# Patient Record
Sex: Female | Born: 1960 | Hispanic: Yes | Marital: Married | Smoking: Never smoker
Health system: Southern US, Community
[De-identification: ages and names within clinical notes are randomized; demographics above are authoritative.]

## PROBLEM LIST (undated history)

## (undated) DIAGNOSIS — E079 Disorder of thyroid, unspecified: Secondary | ICD-10-CM

## (undated) DIAGNOSIS — K219 Gastro-esophageal reflux disease without esophagitis: Secondary | ICD-10-CM

## (undated) DIAGNOSIS — D75839 Thrombocytosis, unspecified: Secondary | ICD-10-CM

## (undated) HISTORY — DX: Disorder of thyroid, unspecified: E07.9

## (undated) HISTORY — DX: Gastro-esophageal reflux disease without esophagitis: K21.9

## (undated) HISTORY — DX: Thrombocytosis, unspecified: D75.839

## (undated) HISTORY — PX: BACK SURGERY: SHX140

---

## 2017-09-04 DIAGNOSIS — E039 Hypothyroidism, unspecified: Secondary | ICD-10-CM | POA: Insufficient documentation

## 2018-11-05 ENCOUNTER — Ambulatory Visit: Payer: Self-pay | Admitting: Family Medicine

## 2018-11-11 ENCOUNTER — Encounter: Payer: Self-pay | Admitting: Family Medicine

## 2018-11-11 ENCOUNTER — Ambulatory Visit (INDEPENDENT_AMBULATORY_CARE_PROVIDER_SITE_OTHER): Payer: Medicare Other | Admitting: Family Medicine

## 2018-11-11 ENCOUNTER — Other Ambulatory Visit: Payer: Self-pay

## 2018-11-11 ENCOUNTER — Telehealth: Payer: Self-pay | Admitting: Family Medicine

## 2018-11-11 VITALS — BP 102/64 | HR 99 | Temp 98.0°F | Ht 59.5 in | Wt 123.8 lb

## 2018-11-11 DIAGNOSIS — M5416 Radiculopathy, lumbar region: Secondary | ICD-10-CM

## 2018-11-11 DIAGNOSIS — E039 Hypothyroidism, unspecified: Secondary | ICD-10-CM | POA: Diagnosis not present

## 2018-11-11 LAB — CBC WITH DIFFERENTIAL/PLATELET
Basophils Absolute: 0 10*3/uL (ref 0.0–0.1)
Basophils Relative: 0.4 % (ref 0.0–3.0)
Eosinophils Absolute: 0.1 10*3/uL (ref 0.0–0.7)
Eosinophils Relative: 1 % (ref 0.0–5.0)
HCT: 42 % (ref 36.0–46.0)
Hemoglobin: 13.9 g/dL (ref 12.0–15.0)
Lymphocytes Relative: 29 % (ref 12.0–46.0)
Lymphs Abs: 1.8 10*3/uL (ref 0.7–4.0)
MCHC: 33 g/dL (ref 30.0–36.0)
MCV: 84.4 fl (ref 78.0–100.0)
Monocytes Absolute: 0.3 10*3/uL (ref 0.1–1.0)
Monocytes Relative: 4.2 % (ref 3.0–12.0)
Neutro Abs: 4 10*3/uL (ref 1.4–7.7)
Neutrophils Relative %: 65.4 % (ref 43.0–77.0)
Platelets: 474 10*3/uL — ABNORMAL HIGH (ref 150.0–400.0)
RBC: 4.98 Mil/uL (ref 3.87–5.11)
RDW: 14.1 % (ref 11.5–15.5)
WBC: 6.1 10*3/uL (ref 4.0–10.5)

## 2018-11-11 LAB — COMPREHENSIVE METABOLIC PANEL
ALT: 27 U/L (ref 0–35)
AST: 22 U/L (ref 0–37)
Albumin: 4.6 g/dL (ref 3.5–5.2)
Alkaline Phosphatase: 81 U/L (ref 39–117)
BUN: 14 mg/dL (ref 6–23)
CO2: 32 mEq/L (ref 19–32)
Calcium: 10.6 mg/dL — ABNORMAL HIGH (ref 8.4–10.5)
Chloride: 101 mEq/L (ref 96–112)
Creatinine, Ser: 0.63 mg/dL (ref 0.40–1.20)
GFR: 96.49 mL/min (ref >60.00)
Glucose, Bld: 107 mg/dL — ABNORMAL HIGH (ref 70–99)
Potassium: 3.8 mEq/L (ref 3.5–5.1)
Sodium: 140 mEq/L (ref 135–145)
Total Bilirubin: 0.3 mg/dL (ref 0.2–1.2)
Total Protein: 7.3 g/dL (ref 6.0–8.3)

## 2018-11-11 LAB — LIPID PANEL
Cholesterol: 255 mg/dL — ABNORMAL HIGH (ref 0–200)
HDL: 43.6 mg/dL (ref >39.00)
NonHDL: 211.24
Total CHOL/HDL Ratio: 6
Triglycerides: 273 mg/dL — ABNORMAL HIGH (ref 0.0–149.0)
VLDL: 54.6 mg/dL — ABNORMAL HIGH (ref 0.0–40.0)

## 2018-11-11 LAB — LDL CHOLESTEROL, DIRECT: Direct LDL: 165 mg/dL

## 2018-11-11 NOTE — Progress Notes (Signed)
esta

## 2018-11-11 NOTE — Telephone Encounter (Signed)
I have updated the patient chart to indicate that she will need a spanish interpreter going forward.

## 2018-11-11 NOTE — Progress Notes (Signed)
Subjective:    Patient ID: Cynthia Mcpherson, female    DOB: 1961-01-23, 58 y.o.   MRN: [id number]  HPI  Patient presents to establish with PCP.  She moved to the area from [facility]  Past medical history includes lumbar radiculopathy and hypothyroidism.  Currently uses Flexeril as needed for muscle spasms; will make a referral to neurosurgeon in the area for follow-up, had back surgery a couple of years ago and was told she would need neurosurgery follow-up.  Patient currently on levothyroxine 100 mcg/day.  Has not had blood work checked in the past 6 to 8 months.   Past medical, surgical, social, family history reviewed and updated accordingly in chart  Patient Active Problem List   Diagnosis Date Noted  . Acquired hypothyroidism 11/12/2018  . Lumbar radiculopathy 11/12/2018   Social History   Tobacco Use  . Smoking status: Never Smoker  . Smokeless tobacco: Never Used  Substance Use Topics  . Alcohol use: Not Currently   History reviewed. No pertinent family history.  Past Surgical History:  Procedure Laterality Date  . BACK SURGERY       Review of Systems  Constitutional: Negative for chills, fatigue and fever.  HENT: Negative for congestion, ear pain, sinus pain and sore throat.   Eyes: Negative.   Respiratory: Negative for cough, shortness of breath and wheezing.   Cardiovascular: Negative for chest pain, palpitations and leg swelling.  Gastrointestinal: Negative for abdominal pain, diarrhea, nausea and vomiting.  Genitourinary: Negative for dysuria, frequency and urgency.  Musculoskeletal: Negative for arthralgias and myalgias.  Skin: Negative for color change, pallor and rash.  Neurological: Negative for syncope, light-headedness and headaches.  Psychiatric/Behavioral: The patient is not nervous/anxious.       Objective:   Physical Exam Vitals signs and nursing note reviewed.  Constitutional:      General: She is not in acute distress.     Appearance: She is not ill-appearing, toxic-appearing or diaphoretic.  HENT:     Head: Normocephalic.  Eyes:     Extraocular Movements: Extraocular movements intact.     Conjunctiva/sclera: Conjunctivae normal.     Pupils: Pupils are equal, round, and reactive to light.  Neck:     Musculoskeletal: Normal range of motion and neck supple. No neck rigidity.     Thyroid: No thyromegaly or thyroid tenderness.     Vascular: No carotid bruit.  Cardiovascular:     Rate and Rhythm: Normal rate and regular rhythm.     Heart sounds: Normal heart sounds.  Pulmonary:     Effort: Pulmonary effort is normal. No respiratory distress.     Breath sounds: Normal breath sounds.  Musculoskeletal: Normal range of motion.     Right lower leg: No edema.     Left lower leg: No edema.     Comments: Able to bend forward, back, side to side with no issues.   Lymphadenopathy:     Cervical: No cervical adenopathy.  Skin:    General: Skin is warm and dry.     Coloration: Skin is not jaundiced or pale.  Neurological:     General: No focal deficit present.     Mental Status: She is alert and oriented to person, place, and time.  Psychiatric:        Mood and Affect: Mood normal.        Behavior: Behavior normal.       Today's Vitals   11/11/18 1008  BP: 102/64  Pulse: 99  Temp: 98 F (36.7 C)  SpO2: 100%  Weight: 123 lb 12.8 oz (56.2 kg)  Height: 4' 11.5" (1.511 m)   Body mass index is 24.59 kg/m.     Assessment & Plan:   Hypothyroidism - she will continue levothyroxine at 100 mcg.  We will get new lab work to follow-up on levels.  Patient request endocrinology for evaluation of her hypothyroidism.  Lumbar radiculopathy- patient will continue Flexeril as needed and do daily stretching exercises.  She requests referral to neurosurgery due to history of back surgery.  Referral placed.  We will plan her to follow-up in 6 months for recheck of chronic medical conditions.  She is allergic to  clinic sooner if any issues arise.

## 2018-11-11 NOTE — Telephone Encounter (Signed)
Thank you :)

## 2018-11-12 ENCOUNTER — Encounter: Payer: Self-pay | Admitting: Family Medicine

## 2018-11-12 DIAGNOSIS — E039 Hypothyroidism, unspecified: Secondary | ICD-10-CM | POA: Insufficient documentation

## 2018-11-12 DIAGNOSIS — M5416 Radiculopathy, lumbar region: Secondary | ICD-10-CM | POA: Insufficient documentation

## 2018-11-12 LAB — THYROID PANEL WITH TSH
Free Thyroxine Index: 3.6 (ref 1.4–3.8)
T3 Uptake: 31 % (ref 22–35)
T4, Total: 11.5 ug/dL (ref 5.1–11.9)
TSH: 0.17 mIU/L — ABNORMAL LOW (ref 0.40–4.50)

## 2018-11-12 MED ORDER — TIROSINT 75 MCG PO CAPS: 75.0000 ug | ORAL_CAPSULE | Freq: Every day | ORAL | 1 refills | Status: DC

## 2018-11-12 NOTE — Addendum Note (Signed)
Addended by: Philis Nettle on: 11/12/2018 12:41 PM   Modules accepted: Orders

## 2018-11-25 ENCOUNTER — Other Ambulatory Visit: Payer: Self-pay

## 2018-11-25 ENCOUNTER — Telehealth: Payer: Self-pay | Admitting: Family Medicine

## 2018-11-25 DIAGNOSIS — E039 Hypothyroidism, unspecified: Secondary | ICD-10-CM

## 2018-11-25 MED ORDER — TIROSINT 75 MCG PO CAPS: 75.0000 ug | ORAL_CAPSULE | Freq: Every day | ORAL | 1 refills | Status: DC

## 2018-11-25 NOTE — Telephone Encounter (Signed)
rx refill Levothyroxine Sodium (TIROSINT) 75 MCG CAPS Medication cyclobenzaprine (FLEXERIL) 10 MG tablet [  PHARMACY [id number] [address], [place] [phone] (Phone) [phone] (Fax)

## 2018-11-25 NOTE — Telephone Encounter (Signed)
Please refill.

## 2018-11-26 ENCOUNTER — Encounter: Payer: Self-pay | Admitting: Lab

## 2018-12-03 ENCOUNTER — Other Ambulatory Visit: Payer: Self-pay | Admitting: Family Medicine

## 2018-12-03 DIAGNOSIS — M5416 Radiculopathy, lumbar region: Secondary | ICD-10-CM

## 2018-12-03 MED ORDER — CYCLOBENZAPRINE HCL 10 MG PO TABS: 10.0000 mg | ORAL_TABLET | Freq: Three times a day (TID) | ORAL | 2 refills | Status: DC | PRN

## 2018-12-03 NOTE — Telephone Encounter (Signed)
Your pt

## 2018-12-03 NOTE — Telephone Encounter (Signed)
Unknown to provider

## 2018-12-07 ENCOUNTER — Other Ambulatory Visit: Payer: Self-pay | Admitting: Student

## 2018-12-07 DIAGNOSIS — G8929 Other chronic pain: Secondary | ICD-10-CM

## 2018-12-22 ENCOUNTER — Ambulatory Visit: Payer: Federal, State, Local not specified - PPO | Admitting: Family Medicine

## 2018-12-28 ENCOUNTER — Telehealth: Payer: Self-pay

## 2018-12-28 NOTE — Telephone Encounter (Signed)
Attempted to call patient for new patient questionaire.  No answer and no answering machine.

## 2018-12-28 NOTE — Progress Notes (Signed)
Patient's Name: Cynthia Mcpherson  MRN: [id number]  Referring Provider: Marin Olp, PA-C  DOB: 30-Sep-1960  PCP: Jodelle Green, FNP  DOS: 12/29/2018  Note by: Gaspar Cola, MD  Service setting: Ambulatory outpatient  Specialty: Interventional Pain Management  Location: [facility] (AMB) Pain Management Facility  Visit type: Initial Patient Evaluation  Patient type: New Patient   Primary Reason(s) for Visit: Encounter for initial evaluation of one or more chronic problems (new to examiner) potentially causing chronic pain, and posing a threat to normal musculoskeletal function. (Level of risk: High) CC: Back Pain (lower left ) and Rectal Pain (left, not rectal pain)  HPI  [name] is a 58 y.o. year old, female patient, who comes today to see [place] for the first time for an initial evaluation of her chronic pain. She has Acquired hypothyroidism; Lumbar radiculopathy; Primary hypothyroidism; Chronic low back pain (Primary Area of Pain) (Left) w/o sciatica; Chronic hip pain (Left); Lumbar facet syndrome (Left); Failed back surgical syndrome; and Lumbar facet arthropathy on their problem list. Today she comes in for evaluation of her Back Pain (lower left ) and Rectal Pain (left, not rectal pain)  Pain Assessment: Location: Left Back Radiating: into buttocks and back of thigh Onset: More than a month ago Duration: Chronic pain Quality: Discomfort, Burning, Stabbing, Tingling Severity: 4 /10 (subjective, self-reported pain score)  Note: Reported level is compatible with observation.                         When using our objective Pain Scale, levels between 6 and 10/10 are said to belong in an emergency room, as it progressively worsens from a 6/10, described as severely limiting, requiring emergency care not usually available at an outpatient pain management facility. At a 6/10 level, communication becomes difficult and requires great effort. Assistance to reach the emergency department may be  required. Facial flushing and profuse sweating along with potentially dangerous increases in heart rate and blood pressure will be evident. Effect on ADL: affects her gait, walking with cane.  when the pain is at its worse she feels like doing nothing.  States that it affects how she is able to manage household chores. Timing: Intermittent Modifying factors: rest, ice, OTC pain patches BP: 128/81  HR: 98  Onset and Duration: Gradual, Date of onset: 2017 and Present longer than 3 months Cause of pain: Work related accident or event Severity: Getting worse, NAS-11 at its worse: 10/10, NAS-11 at its best: 2/10, NAS-11 now: 4/10 and NAS-11 on the average: 4/10 Timing: Evening and After activity or exercise Aggravating Factors: Bending, Motion, Prolonged sitting, Prolonged standing, Squatting, Stooping  and Walking Alleviating Factors: Cold packs and Resting Associated Problems: Inability to concentrate, Tingling, Weakness and Pain that does not allow patient to sleep Quality of Pain: Burning, Deep, Disabling, Distressing, Getting longer, Hot, Pulsating and Stabbing Previous Examinations or Tests: MRI scan, Nerve block and Nerve conduction test Previous Treatments: Chiropractic manipulations, Epidural steroid injections, Stretching exercises and TENS  Cynthia Mcpherson is a 58 y.o. female with history of back pain and leg pain that is been going on for approximately 15 years with worsening over time. Worsening lately. 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 Pain was primarily through the left low lumbar region into the left buttock and left hip.  The notes from [facility] indicated to the patient received LESI in 2009 with no improvement, however, further interrogation reveals  that the patient did not have a lumbar epidural steroid injection, but an injection of Kenalog into the left thigh area.  In 2016 she began to experience worsening of pain and was sent to the pain  clinic but medications did not seem to help her symptoms. Underwent laminectomy on 05/01/2016 and she feels this helped her symptoms a little bit but eventually the left-sided pain returned.  Further questioning has revealed that the pain that the patient was experiencing before and after the back surgery were exactly the same in terms of the location.  Only the intensity w was different.  The pain before the surgery did not involve the leg, except for the referred pain through the posterior aspect of the leg down to the knee.  Specifically, the patient indicates that after the surgery she had relief of her pain for 1-1/2 months after which the pain then came back.  She also stated that when the pain returned, it was identical to before the surgery.  This would suggest a failed back surgical diagnosis.  Most recently evaluated by [facility] neurosurgery around September 2019. MRI and EMG were completed. No findings that warranted surgical intervention.  Presents today with persistent pain and subjective lower extremity weakness.  However, the physical exam today demonstrates the patient to be able to toe walk and heel walk without any problems, and eliminating the possibility of an S1 and or L5 radiculopathy.  DTR today's are rather brisk and present bilaterally for the patella tendon and the Achilles tendon.  The patient demonstrates no weakness of the lower extremities today on physical exam.  It is described as constant but variable in severity. Currently rated 6/10; at best 4/10; at worst 10/10. Pain is described as stabbing and burning in the left low lumbar spine with radiation in the left buttocks, left hip, left medial thigh, and complains of the sensation of "swelling" in the left lateral and anterior thigh. States she is only able to walk about 1 or 2 hours before it becomes too painful and weak. Symptoms aggravated with walking, standing, sitting, changing position, lifting. Relieved with rest, ice,  medications. Currently taking Flexeril 10 mg and Tylenol for pain complaints. She is no longer with pain clinic. She has attempted gabapentin in the past but medication was intolerable.  Denies bladder/bowel dysfunction, saddle paresthesia, recent fall/trauma, right lower extremity symptoms including pain/numbness/tingling/weakness.  She has received two injections for current complaint.  One of them was a Kenalog injection into the anterior portion of the left thigh (not a nerve block).  Received a total of 2 injections with again last injection on 07/01/2017 was a sacroiliac joint injection.  The patient initially indicated that they injections did not help with symptoms.  However, from the diagnostic point of view, she indicates that the last injection did provide her with relief of the pain that lasted for approximately 1 week suggesting that it was done close to the etiology of the pain.  This injection was done by Dr. Cira Servant ([place] physician working for [facility] spine center in [place].  (Tel. No.: [phone]  Fax No.: [phone]).   She has received physical therapy in the past for current complaint. She did not complete a full 6-week session. Last physical therapy session in 2019. It did not provide any improvement in symptoms.  Previous lumbar surgery 2018.  The patient comes into the clinics today for the first time for a chronic pain management evaluation.  According to the patient, her  primary pain is located on the left side of her lower back and goes down to her buttocks and through the back of the leg down to the knee.  She occasionally will feel some discomfort in the medial portion of the foot suggesting an L4 radiculitis.  However, the patient has had nerve conduction test that were negative for radiculopathy.  The patient indicates that the low back pain is worse than the lower extremity discomfort.  Her lumbar surgery was done in 2018.  Before the surgery the patient did not  have any epidural steroid injections.  The only thing that she did have done was an injection of Kenalog into the anterior portion of her thigh.  This was not done with fluoroscopy and therefore it is unlikely to have been a hip joint injection.  This was done in [place], [place] by a Dr. Odetta Pink.  This injection was done in 2017 and there are no available records of the procedure.   Today I took the time to provide the patient with information regarding my pain practice. The patient was informed that my practice is divided into two sections: an interventional pain management section, as well as a completely separate and distinct medication management section. I explained that I have procedure days for my interventional therapies, and evaluation days for follow-ups and medication management. Because of the amount of documentation required during both, they are kept separated. This means that there is the possibility that she may be scheduled for a procedure on one day, and medication management the next. I have also informed her that because of staffing and facility limitations, I no longer take patients for medication management only. To illustrate the reasons for this, I gave the patient the example of surgeons, and how inappropriate it would be to refer a patient to his/her care, just to write for the post-surgical antibiotics on a surgery done by a different surgeon.   Because interventional pain management is my board-certified specialty, the patient was informed that joining my practice means that they are open to any and all interventional therapies. I made it clear that this does not mean that they will be forced to have any procedures done. What this means is that I believe interventional therapies to be essential part of the diagnosis and proper management of chronic pain conditions. Therefore, patients not interested in these interventional alternatives will be better served under the care of a different  practitioner.  The patient was also made aware of my Comprehensive Pain Management Safety Guidelines where by joining my practice, they limit all of their nerve blocks and joint injections to those done by our practice, for as long as we are retained to manage their care.   Historic Controlled Substance Pharmacotherapy Review  PMP: No controlled substances prescribed in [place].  However, I did find that the patient had received some tramadol in [place]. Medications: The patient did not bring the medication(s) to the appointment, as requested in our "New Patient Package" Pharmacodynamics: Desired effects: Analgesia: The patient reports >50% benefit. Reported improvement in function: The patient reports medication allows her to accomplish basic ADLs. Clinically meaningful improvement in function (CMIF): Sustained CMIF goals met Perceived effectiveness: Described as relatively effective, allowing for increase in activities of daily living (ADL) Undesirable effects: Side-effects or Adverse reactions: None reported Historical Monitoring: The patient  reports no history of drug use. List of all UDS Test(s): No results found. List of other Serum/Urine Drug Screening Test(s):  No results found.  Historical Background Evaluation: [place] PMP: PDMP reviewed during this encounter. Six (6) year initial data search conducted.             PMP NARX Score Report:  Narcotic: 000 Sedative: 000 Stimulant: 000 [place] Department of public safety, offender search: Editor, commissioning Information) Non-contributory Risk Assessment Profile: Aberrant behavior: None observed or detected today Risk factors for fatal opioid overdose: None identified today PMP NARX Overdose Risk Score: 000 Fatal overdose hazard ratio (HR): Calculation deferred Non-fatal overdose hazard ratio (HR): Calculation deferred Risk of opioid abuse or dependence: 0.7-3.0% with doses ? 36 MME/day and 6.1-26% with doses ? 120 MME/day. Substance use  disorder (SUD) risk level: See below Personal History of Substance Abuse (SUD-Substance use disorder):  Alcohol: Negative  Illegal Drugs: Negative  Rx Drugs: Negative  ORT Risk Level calculation: Low Risk Opioid Risk Tool - 12/29/18 1019      Family History of Substance Abuse   Alcohol  Negative    Illegal Drugs  Negative    Rx Drugs  Negative      Personal History of Substance Abuse   Alcohol  Negative    Illegal Drugs  Negative    Rx Drugs  Negative      Age   Age between 68-45 years   No      Psychological Disease   Psychological Disease  Negative    Depression  Negative      Total Score   Opioid Risk Tool Scoring  0    Opioid Risk Interpretation  Low Risk      ORT Scoring interpretation table:  Score <3 = Low Risk for SUD  Score between 4-7 = Moderate Risk for SUD  Score >8 = High Risk for Opioid Abuse   PHQ-2 Depression Scale:  Total score: 0  PHQ-2 Scoring interpretation table: (Score and probability of major depressive disorder)  Score 0 = No depression  Score 1 = 15.4% Probability  Score 2 = 21.1% Probability  Score 3 = 38.4% Probability  Score 4 = 45.5% Probability  Score 5 = 56.4% Probability  Score 6 = 78.6% Probability   PHQ-9 Depression Scale:  Total score: 3  PHQ-9 Scoring interpretation table:  Score 0-4 = No depression  Score 5-9 = Mild depression  Score 10-14 = Moderate depression  Score 15-19 = Moderately severe depression  Score 20-27 = Severe depression (2.4 times higher risk of SUD and 2.89 times higher risk of overuse)   Pharmacologic Plan: As per protocol, I have not taken over any controlled substance management, pending the results of ordered tests and/or consults.            Initial impression: Pending review of available data and ordered tests.  Meds   Current Outpatient Medications:  .  acetaminophen (TYLENOL) 500 MG tablet, Take 500 mg by mouth every 6 (six) hours as needed., Disp: , Rfl:  .  cyclobenzaprine (FLEXERIL) 10 MG  tablet, Take 1 tablet (10 mg total) by mouth 3 (three) times daily as needed for muscle spasms., Disp: 30 tablet, Rfl: 2 .  esomeprazole (NEXIUM) 40 MG capsule, Take 40 mg by mouth 2 (two) times daily as needed., Disp: , Rfl:  .  Levothyroxine Sodium (TIROSINT) 75 MCG CAPS, Take 1 capsule (75 mcg total) by mouth daily before breakfast., Disp: 90 capsule, Rfl: 1 .  predniSONE (STERAPRED UNI-PAK 48 TAB) 10 MG (48) TBPK tablet, Take 1 tablet by mouth as directed., Disp: , Rfl:   Imaging Review  Complexity Note: No results found under the [place] record.                         ROS  Cardiovascular: No reported cardiovascular signs or symptoms such as High blood pressure, coronary artery disease, abnormal heart rate or rhythm, heart attack, blood thinner therapy or heart weakness and/or failure Pulmonary or Respiratory: No reported pulmonary signs or symptoms such as wheezing and difficulty taking a deep full breath (Asthma), difficulty blowing air out (Emphysema), coughing up mucus (Bronchitis), persistent dry cough, or temporary stoppage of breathing during sleep Neurological: No reported neurological signs or symptoms such as seizures, abnormal skin sensations, urinary and/or fecal incontinence, being born with an abnormal open spine and/or a tethered spinal cord Review of Past Neurological Studies: No results found for this or any previous visit. Psychological-Psychiatric: No reported psychological or psychiatric signs or symptoms such as difficulty sleeping, anxiety, depression, delusions or hallucinations (schizophrenial), mood swings (bipolar disorders) or suicidal ideations or attempts Gastrointestinal: No reported gastrointestinal signs or symptoms such as vomiting or evacuating blood, reflux, heartburn, alternating episodes of diarrhea and constipation, inflamed or scarred liver, or pancreas or irrregular and/or infrequent bowel movements Genitourinary: No reported  renal or genitourinary signs or symptoms such as difficulty voiding or producing urine, peeing blood, non-functioning kidney, kidney stones, difficulty emptying the bladder, difficulty controlling the flow of urine, or chronic kidney disease Hematological: No reported hematological signs or symptoms such as prolonged bleeding, low or poor functioning platelets, bruising or bleeding easily, hereditary bleeding problems, low energy levels due to low hemoglobin or being anemic Endocrine: Slow thyroid Rheumatologic: No reported rheumatological signs and symptoms such as fatigue, joint pain, tenderness, swelling, redness, heat, stiffness, decreased range of motion, with or without associated rash Musculoskeletal: Negative for myasthenia gravis, muscular dystrophy, multiple sclerosis or malignant hyperthermia Work History: Retired  Allergies  [name] is allergic to influenza vaccines; latex; and shellfish allergy.  Laboratory Chemistry Profile   Screening No results found.  Inflammation (CRP: Acute Phase) (ESR: Chronic Phase) No results found.  Rheumatology No results found.  Renal Lab Results  Component Value Date   BUN 14 11/11/2018   CREATININE 0.63 11/11/2018   GFR 96.49 11/11/2018                             Hepatic Lab Results  Component Value Date   AST 22 11/11/2018   ALT 27 11/11/2018   ALBUMIN 4.6 11/11/2018   ALKPHOS 81 11/11/2018                        Electrolytes Lab Results  Component Value Date   NA 140 11/11/2018   K 3.8 11/11/2018   CL 101 11/11/2018   CALCIUM 10.6 (H) 11/11/2018                        Neuropathy No results found.  CNS No results found.  Bone No results found.  Coagulation Lab Results  Component Value Date   PLT 474.0 (H) 11/11/2018                        Cardiovascular Lab Results  Component Value Date   HGB 13.9 11/11/2018   HCT 42.0 11/11/2018  ID No results found.  Cancer No results  found.  Endocrine Lab Results  Component Value Date   TSH 0.17 (L) 11/11/2018                        Note: Lab results reviewed.  [place]  Drug: [name]  reports no history of drug use. Alcohol:  reports previous alcohol use. Tobacco:  reports that she has never smoked. She has never used smokeless tobacco. Medical:  has a past medical history of Thyroid disease. Family: family history includes Hypertension in her brother; Multiple sclerosis in her sister; Psoriasis in her mother; Stroke in her sister.  Past Surgical History:  Procedure Laterality Date  . BACK SURGERY     Active Ambulatory Problems    Diagnosis Date Noted  . Acquired hypothyroidism 11/12/2018  . Lumbar radiculopathy 11/12/2018  . Primary hypothyroidism 09/04/2017  . Chronic low back pain (Primary Area of Pain) (Left) w/o sciatica 12/29/2018  . Chronic hip pain (Left) 12/29/2018  . Lumbar facet syndrome (Left) 12/29/2018  . Failed back surgical syndrome 12/29/2018  . Lumbar facet arthropathy 12/29/2018   Resolved Ambulatory Problems    Diagnosis Date Noted  . No Resolved Ambulatory Problems   Past Medical History:  Diagnosis Date  . Thyroid disease    Constitutional Exam  General appearance: Well nourished, well developed, and well hydrated. In no apparent acute distress Vitals:   12/29/18 1007  BP: 128/81  Pulse: 98  Resp: 16  Temp: 98.2 F (36.8 C)  TempSrc: Oral  SpO2: 100%  Weight: 120 lb (54.4 kg)  Height: 5' (1.524 m)   BMI Assessment: Estimated body mass index is 23.44 kg/m as calculated from the following:   Height as of this encounter: 5' (1.524 m).   Weight as of this encounter: 120 lb (54.4 kg).  BMI interpretation table: BMI level Category Range association with higher incidence of chronic pain  <18 kg/m2 Underweight   18.5-24.9 kg/m2 Ideal body weight   25-29.9 kg/m2 Overweight Increased incidence by 20%  30-34.9 kg/m2 Obese (Class I) Increased incidence by 68%  35-39.9  kg/m2 Severe obesity (Class II) Increased incidence by 136%  >40 kg/m2 Extreme obesity (Class III) Increased incidence by 254%   Patient's current BMI Ideal Body weight  Body mass index is 23.44 kg/m. Ideal body weight: 45.5 kg (100 lb 4.9 oz) Adjusted ideal body weight: 49.1 kg (108 lb 3 oz)   BMI Readings from Last 4 Encounters:  12/29/18 23.44 kg/m  11/11/18 24.59 kg/m   Wt Readings from Last 4 Encounters:  12/29/18 120 lb (54.4 kg)  11/11/18 123 lb 12.8 oz (56.2 kg)  Psych/Mental status: Alert, oriented x 3 (person, place, & time)       Eyes: PERLA Respiratory: No evidence of acute respiratory distress  Lumbar Spine Area Exam  Skin & Axial Inspection: Well healed scar from previous spine surgery detected Alignment: Symmetrical Functional ROM: Unrestricted ROM       Stability: No instability detected Muscle Tone/Strength: Functionally intact. No obvious neuro-muscular anomalies detected. Sensory (Neurological): Unimpaired Palpation: No palpable anomalies       Provocative Tests: Hyperextension/rotation test: (+) on the left for facet joint pain. Lumbar quadrant test (Kemp's test): (-)       Lateral bending test: (-)       Patrick's Maneuver: (-)                   FABER* test: (-)  S-I anterior distraction/compression test: (-)         S-I lateral compression test: (-)         S-I Thigh-thrust test: (-)         S-I Gaenslen's test: deferred today         *(Flexion, ABduction and External Rotation)  Gait & Posture Assessment  Ambulation: Patient ambulates using a cane Gait: Relatively normal for age and body habitus Posture: WNL   Lower Extremity Exam    Side: Right lower extremity  Side: Left lower extremity  Stability: No instability observed          Stability: No instability observed          Skin & Extremity Inspection: Skin color, temperature, and hair growth are WNL. No peripheral edema or cyanosis. No masses, redness, swelling, asymmetry, or  associated skin lesions. No contractures.  Skin & Extremity Inspection: Skin color, temperature, and hair growth are WNL. No peripheral edema or cyanosis. No masses, redness, swelling, asymmetry, or associated skin lesions. No contractures.  Functional ROM: Unrestricted ROM                  Functional ROM: Unrestricted ROM                  Muscle Tone/Strength: Functionally intact. No obvious neuro-muscular anomalies detected.  Muscle Tone/Strength: Functionally intact. No obvious neuro-muscular anomalies detected.  Sensory (Neurological): Unimpaired        Sensory (Neurological): Unimpaired        DTR: Patellar: 3+: brisk Achilles: 3+: brisk Plantar: deferred today  DTR: Patellar: 3+: brisk Achilles: 3+: brisk Plantar: deferred today  Palpation: No palpable anomalies  Palpation: No palpable anomalies   Assessment  Primary Diagnosis & Pertinent Problem List: The primary encounter diagnosis was Chronic low back pain (Primary Area of Pain) (Left) w/o sciatica. Diagnoses of Chronic hip pain (Left), Lumbar facet syndrome (Left), Failed back surgical syndrome, and Lumbar facet arthropathy were also pertinent to this visit.  Visit Diagnosis (New problems to examiner): 1. Chronic low back pain (Primary Area of Pain) (Left) w/o sciatica   2. Chronic hip pain (Left)   3. Lumbar facet syndrome (Left)   4. Failed back surgical syndrome   5. Lumbar facet arthropathy    Plan of Care (Initial workup plan)  Note: [name] was reminded that as per protocol, today's visit has been an evaluation only. We have not taken over the patient's controlled substance management.  Problem-specific plan: No problem-specific Assessment & Plan notes found for this encounter.  Lab Orders  No laboratory test(s) ordered today    Imaging Orders     DG Lumbar Spine Complete w/ Flex/Ext (6 Views) Referral Orders  No referral(s) requested today    Procedure Orders     L-FCT Blk (Schedule) Pharmacotherapy  (current): Medications ordered:  No orders of the defined types were placed in this encounter.  Medications administered during this visit: Marie-Lourdes Torres had no medications administered during this visit.   Pharmacological management options:  Opioid Analgesics: The patient was informed that there is no guarantee that she would be a candidate for opioid analgesics. The decision will be made following CDC guidelines. This decision will be based on the results of diagnostic studies, as well as Ms. Torres's risk profile.   Membrane stabilizer: To be determined at a later time  Muscle relaxant: To be determined at a later time  NSAID: To be determined at a later time  Other  analgesic(s): To be determined at a later time   Interventional management options: [name] was informed that there is no guarantee that she would be a candidate for interventional therapies. The decision will be based on the results of diagnostic studies, as well as Ms. Torres's risk profile.  Procedure(s) under consideration:  Diagnostic left-sided lumbar facet block #1 under fluoroscopic guidance and IV sedation  Possible left-sided lumbar facet RFA    Provider-requested follow-up: Return for Procedure (w/ sedation): (L) L-FCT BLK #1.  Future Appointments  Date Time Provider [place]  01/06/2019  9:15 AM Milinda Pointer, MD [facility] None    Primary Care Physician: Jodelle Green, FNP Location: [facility] Note by: Gaspar Cola, MD Date: 12/29/2018; Time: 12:32 PM  Note: This dictation was prepared with Dragon dictation. Any transcriptional errors that may result from this process are unintentional.

## 2018-12-29 ENCOUNTER — Encounter: Payer: Self-pay | Admitting: Pain Medicine

## 2018-12-29 ENCOUNTER — Other Ambulatory Visit: Payer: Self-pay

## 2018-12-29 ENCOUNTER — Ambulatory Visit: Payer: Medicare Other | Attending: Pain Medicine | Admitting: Pain Medicine

## 2018-12-29 VITALS — BP 128/81 | HR 98 | Temp 98.2°F | Resp 16 | Ht 60.0 in | Wt 120.0 lb

## 2018-12-29 DIAGNOSIS — M25552 Pain in left hip: Secondary | ICD-10-CM | POA: Diagnosis not present

## 2018-12-29 DIAGNOSIS — M961 Postlaminectomy syndrome, not elsewhere classified: Secondary | ICD-10-CM | POA: Insufficient documentation

## 2018-12-29 DIAGNOSIS — M545 Low back pain: Secondary | ICD-10-CM | POA: Insufficient documentation

## 2018-12-29 DIAGNOSIS — G8929 Other chronic pain: Secondary | ICD-10-CM | POA: Diagnosis present

## 2018-12-29 DIAGNOSIS — M47816 Spondylosis without myelopathy or radiculopathy, lumbar region: Secondary | ICD-10-CM | POA: Diagnosis not present

## 2018-12-29 NOTE — Patient Instructions (Addendum)
____________________________________________________________________________________________  Preparing for Procedure with Sedation  Procedure appointments are limited to planned procedures: . No Prescription Refills. . No disability issues will be discussed. . No medication changes will be discussed.  Instructions: . Oral Intake: Do not eat or drink anything for at least 8 hours prior to your procedure. . Transportation: Public transportation is not allowed. Bring an adult driver. The driver must be physically present in our waiting room before any procedure can be started. . Physical Assistance: Bring an adult physically capable of assisting you, in the event you need help. This adult should keep you company at home for at least 6 hours after the procedure. . Blood Pressure Medicine: Take your blood pressure medicine with a sip of water the morning of the procedure. . Blood thinners: Notify our staff if you are taking any blood thinners. Depending on which one you take, there will be specific instructions on how and when to stop it. . Diabetics on insulin: Notify the staff so that you can be scheduled 1st case in the morning. If your diabetes requires high dose insulin, take only  of your normal insulin dose the morning of the procedure and notify the staff that you have done so. . Preventing infections: Shower with an antibacterial soap the morning of your procedure. . Build-up your immune system: Take 1000 mg of Vitamin C with every meal (3 times a day) the day prior to your procedure. . Antibiotics: Inform the staff if you have a condition or reason that requires you to take antibiotics before dental procedures. . Pregnancy: If you are pregnant, call and cancel the procedure. . Sickness: If you have a cold, fever, or any active infections, call and cancel the procedure. . Arrival: You must be in the facility at least 30 minutes prior to your scheduled procedure. . Children: Do not bring  children with you. . Dress appropriately: Bring dark clothing that you would not mind if they get stained. . Valuables: Do not bring any jewelry or valuables.  Reasons to call and reschedule or cancel your procedure: (Following these recommendations will minimize the risk of a serious complication.) . Surgeries: Avoid having procedures within 2 weeks of any surgery. (Avoid for 2 weeks before or after any surgery). . Flu Shots: Avoid having procedures within 2 weeks of a flu shots or . (Avoid for 2 weeks before or after immunizations). . Barium: Avoid having a procedure within 7-10 days after having had a radiological study involving the use of radiological contrast. (Myelograms, Barium swallow or enema study). . Heart attacks: Avoid any elective procedures or surgeries for the initial 6 months after a "Myocardial Infarction" (Heart Attack). . Blood thinners: It is imperative that you stop these medications before procedures. Let us know if you if you take any blood thinner.  . Infection: Avoid procedures during or within two weeks of an infection (including chest colds or gastrointestinal problems). Symptoms associated with infections include: Localized redness, fever, chills, night sweats or profuse sweating, burning sensation when voiding, cough, congestion, stuffiness, runny nose, sore throat, diarrhea, nausea, vomiting, cold or Flu symptoms, recent or current infections. It is specially important if the infection is over the area that we intend to treat. . Heart and lung problems: Symptoms that may suggest an active cardiopulmonary problem include: cough, chest pain, breathing difficulties or shortness of breath, dizziness, ankle swelling, uncontrolled high or unusually low blood pressure, and/or palpitations. If you are experiencing any of these symptoms, cancel your procedure and contact   your primary care physician for an evaluation.  Remember:  Regular Business hours are:  Monday to Thursday  8:00 AM to 4:00 PM  Provider's Schedule: Christien Frankl, MD:  Procedure days: Tuesday and Thursday 7:30 AM to 4:00 PM  Bilal Lateef, MD:  Procedure days: Monday and Wednesday 7:30 AM to 4:00 PM ____________________________________________________________________________________________   ____________________________________________________________________________________________  General Risks and Possible Complications  Patient Responsibilities: It is important that you read this as it is part of your informed consent. It is our duty to inform you of the risks and possible complications associated with treatments offered to you. It is your responsibility as a patient to read this and to ask questions about anything that is not clear or that you believe was not covered in this document.  Patient's Rights: You have the right to refuse treatment. You also have the right to change your mind, even after initially having agreed to have the treatment done. However, under this last option, if you wait until the last second to change your mind, you may be charged for the materials used up to that point.  Introduction: Medicine is not an exact science. Everything in Medicine, including the lack of treatment(s), carries the potential for danger, harm, or loss (which is by definition: Risk). In Medicine, a complication is a secondary problem, condition, or disease that can aggravate an already existing one. All treatments carry the risk of possible complications. The fact that a side effects or complications occurs, does not imply that the treatment was conducted incorrectly. It must be clearly understood that these can happen even when everything is done following the highest safety standards.  No treatment: You can choose not to proceed with the proposed treatment alternative. The "PRO(s)" would include: avoiding the risk of complications associated with the therapy. The "CON(s)" would include: not  getting any of the treatment benefits. These benefits fall under one of three categories: diagnostic; therapeutic; and/or palliative. Diagnostic benefits include: getting information which can ultimately lead to improvement of the disease or symptom(s). Therapeutic benefits are those associated with the successful treatment of the disease. Finally, palliative benefits are those related to the decrease of the primary symptoms, without necessarily curing the condition (example: decreasing the pain from a flare-up of a chronic condition, such as incurable terminal cancer).  General Risks and Complications: These are associated to most interventional treatments. They can occur alone, or in combination. They fall under one of the following six (6) categories: no benefit or worsening of symptoms; bleeding; infection; nerve damage; allergic reactions; and/or death. 1. No benefits or worsening of symptoms: In Medicine there are no guarantees, only probabilities. No healthcare provider can ever guarantee that a medical treatment will work, they can only state the probability that it may. Furthermore, there is always the possibility that the condition may worsen, either directly, or indirectly, as a consequence of the treatment. 2. Bleeding: This is more common if the patient is taking a blood thinner, either prescription or over the counter (example: Goody Powders, Fish oil, Aspirin, Garlic, etc.), or if suffering a condition associated with impaired coagulation (example: Hemophilia, cirrhosis of the liver, low platelet counts, etc.). However, even if you do not have one on these, it can still happen. If you have any of these conditions, or take one of these drugs, make sure to notify your treating physician. 3. Infection: This is more common in patients with a compromised immune system, either due to disease (example: diabetes, cancer, human immunodeficiency virus [HIV],   etc.), or due to medications or treatments  (example: therapies used to treat cancer and rheumatological diseases). However, even if you do not have one on these, it can still happen. If you have any of these conditions, or take one of these drugs, make sure to notify your treating physician. 4. Nerve Damage: This is more common when the treatment is an invasive one, but it can also happen with the use of medications, such as those used in the treatment of cancer. The damage can occur to small secondary nerves, or to large primary ones, such as those in the spinal cord and brain. This damage may be temporary or permanent and it may lead to impairments that can range from temporary numbness to permanent paralysis and/or brain death. 5. Allergic Reactions: Any time a substance or material comes in contact with our body, there is the possibility of an allergic reaction. These can range from a mild skin rash (contact dermatitis) to a severe systemic reaction (anaphylactic reaction), which can result in death. 6. Death: In general, any medical intervention can result in death, most of the time due to an unforeseen complication. ____________________________________________________________________________________________  Facet Blocks Patient Information  Description: The facets are joints in the spine between the vertebrae.  Like any joints in the body, facets can become irritated and painful.  Arthritis can also effect the facets.  By injecting steroids and local anesthetic in and around these joints, we can temporarily block the nerve supply to them.  Steroids act directly on irritated nerves and tissues to reduce selling and inflammation which often leads to decreased pain.  Facet blocks may be done anywhere along the spine from the neck to the low back depending upon the location of your pain.   After numbing the skin with local anesthetic (like Novocaine), a small needle is passed onto the facet joints under x-ray guidance.  You may experience a  sensation of pressure while this is being done.  The entire block usually lasts about 15-25 minutes.   Conditions which may be treated by facet blocks:   Low back/buttock pain  Neck/shoulder pain  Certain types of headaches  Preparation for the injection:  1. Do not eat any solid food or dairy products within 8 hours of your appointment. 2. You may drink clear liquid up to 3 hours before appointment.  Clear liquids include water, black coffee, juice or soda.  No milk or cream please. 3. You may take your regular medication, including pain medications, with a sip of water before your appointment.  Diabetics should hold regular insulin (if taken separately) and take 1/2 normal NPH dose the morning of the procedure.  Carry some sugar containing items with you to your appointment. 4. A driver must accompany you and be prepared to drive you home after your procedure. 5. Bring all your current medications with you. 6. An IV may be inserted and sedation may be given at the discretion of the physician. 7. A blood pressure cuff, EKG and other monitors will often be applied during the procedure.  Some patients may need to have extra oxygen administered for a short period. 8. You will be asked to provide medical information, including your allergies and medications, prior to the procedure.  We must know immediately if you are taking blood thinners (like Coumadin/Warfarin) or if you are allergic to IV iodine contrast (dye).  We must know if you could possible be pregnant.  Possible side-effects:   Bleeding from needle site  Infection (  rare, may require surgery)  Nerve injury (rare)  Numbness & tingling (temporary)  Difficulty urinating (rare, temporary)  Spinal headache (a headache worse with upright posture)  Light-headedness (temporary)  Pain at injection site (serveral days)  Decreased blood pressure (rare, temporary)  Weakness in arm/leg (temporary)  Pressure sensation in  back/neck (temporary)   Call if you experience:   Fever/chills associated with headache or increased back/neck pain  Headache worsened by an upright position  New onset, weakness or numbness of an extremity below the injection site  Hives or difficulty breathing (go to the emergency room)  Inflammation or drainage at the injection site(s)  Severe back/neck pain greater than usual  New symptoms which are concerning to you  Please note:  Although the local anesthetic injected can often make your back or neck feel good for several hours after the injection, the pain will likely return. It takes 3-7 days for steroids to work.  You may not notice any pain relief for at least one week.  If effective, we will often do a series of 2-3 injections spaced 3-6 weeks apart to maximally decrease your pain.  After the initial series, you may be a candidate for a more permanent nerve block of the facets.  If you have any questions, please call #[phone] [facility]

## 2019-01-06 ENCOUNTER — Other Ambulatory Visit: Payer: Self-pay

## 2019-01-06 ENCOUNTER — Encounter: Payer: Self-pay | Admitting: Pain Medicine

## 2019-01-06 ENCOUNTER — Ambulatory Visit
Admission: RE | Admit: 2019-01-06 | Discharge: 2019-01-06 | Disposition: A | Discharge status: Home / Self Care | Payer: Medicare Other | Source: Ambulatory Visit | Attending: Pain Medicine | Admitting: Pain Medicine

## 2019-01-06 ENCOUNTER — Ambulatory Visit (HOSPITAL_BASED_OUTPATIENT_CLINIC_OR_DEPARTMENT_OTHER): Payer: Medicare Other | Admitting: Pain Medicine

## 2019-01-06 VITALS — BP 125/63 | HR 101 | Temp 97.2°F | Resp 12 | Ht 60.0 in | Wt 120.0 lb

## 2019-01-06 DIAGNOSIS — M5137 Other intervertebral disc degeneration, lumbosacral region: Secondary | ICD-10-CM

## 2019-01-06 DIAGNOSIS — Z91040 Latex allergy status: Secondary | ICD-10-CM | POA: Diagnosis present

## 2019-01-06 DIAGNOSIS — M47816 Spondylosis without myelopathy or radiculopathy, lumbar region: Secondary | ICD-10-CM

## 2019-01-06 DIAGNOSIS — Z91041 Radiographic dye allergy status: Secondary | ICD-10-CM | POA: Diagnosis present

## 2019-01-06 DIAGNOSIS — Z883 Allergy status to other anti-infective agents status: Secondary | ICD-10-CM

## 2019-01-06 DIAGNOSIS — M545 Low back pain, unspecified: Secondary | ICD-10-CM

## 2019-01-06 DIAGNOSIS — M47817 Spondylosis without myelopathy or radiculopathy, lumbosacral region: Secondary | ICD-10-CM | POA: Diagnosis not present

## 2019-01-06 DIAGNOSIS — Z91013 Allergy to seafood: Secondary | ICD-10-CM | POA: Insufficient documentation

## 2019-01-06 DIAGNOSIS — G8929 Other chronic pain: Secondary | ICD-10-CM

## 2019-01-06 DIAGNOSIS — M961 Postlaminectomy syndrome, not elsewhere classified: Secondary | ICD-10-CM | POA: Diagnosis present

## 2019-01-06 MED ORDER — ROPIVACAINE HCL 2 MG/ML IJ SOLN
9.0000 mL | Freq: Once | INTRAMUSCULAR | Status: AC
  Administered 2019-01-06: 9 mL via PERINEURAL
  Filled 2019-01-06: qty 10

## 2019-01-06 MED ORDER — DIPHENHYDRAMINE HCL 50 MG/ML IJ SOLN: 25.0000 mg | Freq: Once | INTRAMUSCULAR | Status: DC

## 2019-01-06 MED ORDER — LIDOCAINE HCL 2 % IJ SOLN
20.0000 mL | Freq: Once | INTRAMUSCULAR | Status: AC
  Administered 2019-01-06: 400 mg
  Filled 2019-01-06: qty 40

## 2019-01-06 MED ORDER — DIPHENHYDRAMINE HCL 50 MG/ML IJ SOLN
INTRAMUSCULAR | Status: AC
  Filled 2019-01-06: qty 1

## 2019-01-06 MED ORDER — FENTANYL CITRATE (PF) 100 MCG/2ML IJ SOLN
25.0000 ug | INTRAMUSCULAR | Status: DC | PRN
  Administered 2019-01-06: 50 ug via INTRAVENOUS
  Filled 2019-01-06: qty 2

## 2019-01-06 MED ORDER — LACTATED RINGERS IV SOLN
1000.0000 mL | Freq: Once | INTRAVENOUS | Status: AC
  Administered 2019-01-06: 1000 mL via INTRAVENOUS

## 2019-01-06 MED ORDER — TRIAMCINOLONE ACETONIDE 40 MG/ML IJ SUSP
40.0000 mg | Freq: Once | INTRAMUSCULAR | Status: AC
  Administered 2019-01-06: 40 mg
  Filled 2019-01-06: qty 1

## 2019-01-06 MED ORDER — MIDAZOLAM HCL 5 MG/5ML IJ SOLN
1.0000 mg | INTRAMUSCULAR | Status: DC | PRN
  Administered 2019-01-06: 1 mg via INTRAVENOUS
  Filled 2019-01-06: qty 5

## 2019-01-06 NOTE — Progress Notes (Signed)
Safety precautions to be maintained throughout the outpatient stay will include: orient to surroundings, keep bed in low position, maintain call bell within reach at all times, provide assistance with transfer out of bed and ambulation.  

## 2019-01-06 NOTE — Patient Instructions (Signed)

## 2019-01-06 NOTE — Progress Notes (Signed)
Patient's Name: Cynthia Mcpherson  MRN: [id number]  Referring Provider: Jodelle Green, FNP  DOB: [birth year]  PCP: Jodelle Green, FNP  DOS: 01/06/2019  Note by: Gaspar Cola, MD  Service setting: Ambulatory outpatient  Specialty: Interventional Pain Management  Patient type: Established  Location: [facility] (AMB) Pain Management Facility  Visit type: Interventional Procedure   Primary Reason for Visit: Interventional Pain Management Treatment. CC: Back Pain  Procedure:          Anesthesia, Analgesia, Anxiolysis:  Type: Lumbar Facet, Medial Branch Block(s) #1  Primary Purpose: Diagnostic Region: Posterolateral Lumbosacral Spine Level: L2, L3, L4, L5, & S1 Medial Branch Level(s). Injecting these levels blocks the L3-4, L4-5, and L5-S1 lumbar facet joints. Laterality: Left  Type: Moderate (Conscious) Sedation combined with Local Anesthesia Indication(s): Analgesia and Anxiety Route: Intravenous (IV) IV Access: Secured Sedation: Meaningful verbal contact was maintained at all times during the procedure  Local Anesthetic: Lidocaine 1-2%  Position: Prone   Indications: 1. Spondylosis without myelopathy or radiculopathy, lumbosacral region   2. Lumbar facet syndrome (Left)   3. Lumbar facet arthropathy   4. Chronic low back pain (Primary Area of Pain) (Left) w/o sciatica   5. Failed back surgical syndrome   6. DDD (degenerative disc disease), lumbosacral    Pain Score: Pre-procedure: 2 /10 Post-procedure: (P) 0-No pain/10   NOTE: The patient did not do the lumbar flexion and extension x-rays that I ordered for her.   Pre-op Assessment:  Cynthia Mcpherson is a 58 y.o. (year old), female patient, seen today for interventional treatment. She  has a past surgical history that includes Back surgery. Cynthia Mcpherson has a current medication list which includes the following prescription(s): acetaminophen, cyclobenzaprine, esomeprazole, tirosint, and prednisone, and the following  Facility-Administered Medications: diphenhydramine, fentanyl, and midazolam. Her primarily concern today is the Back Pain  Initial Vital Signs:  Pulse/HCG Rate: (!) 101ECG Heart Rate: 94 Temp: (!) 97.2 F (36.2 C) Resp: 19 BP: 129/89 SpO2: 100 %  BMI: Estimated body mass index is 23.44 kg/m as calculated from the following:   Height as of this encounter: 5' (1.524 m).   Weight as of this encounter: 120 lb (54.4 kg).  Risk Assessment: Allergies: Reviewed. She is allergic to influenza vaccines; iodinated diagnostic agents; latex; and shellfish allergy.  Allergy Precautions: None required Coagulopathies: Reviewed. None identified.  Blood-thinner therapy: None at this time Active Infection(s): Reviewed. None identified. Cynthia Mcpherson is afebrile  Site Confirmation: Cynthia Mcpherson was asked to confirm the procedure and laterality before marking the site Procedure checklist: Completed Consent: Before the procedure and under the influence of no sedative(s), amnesic(s), or anxiolytics, the patient was informed of the treatment options, risks and possible complications. To fulfill our ethical and legal obligations, as recommended by the American Medical Association's Code of Ethics, I have informed the patient of my clinical impression; the nature and purpose of the treatment or procedure; the risks, benefits, and possible complications of the intervention; the alternatives, including doing nothing; the risk(s) and benefit(s) of the alternative treatment(s) or procedure(s); and the risk(s) and benefit(s) of doing nothing. The patient was provided information about the general risks and possible complications associated with the procedure. These may include, but are not limited to: failure to achieve desired goals, infection, bleeding, organ or nerve damage, allergic reactions, paralysis, and death. In addition, the patient was informed of those risks and complications associated to Spine-related  procedures, such as failure to decrease pain; infection (i.e.: Meningitis, epidural or intraspinal  abscess); bleeding (i.e.: epidural hematoma, subarachnoid hemorrhage, or any other type of intraspinal or peri-dural bleeding); organ or nerve damage (i.e.: Any type of peripheral nerve, nerve root, or spinal cord injury) with subsequent damage to sensory, motor, and/or autonomic systems, resulting in permanent pain, numbness, and/or weakness of one or several areas of the body; allergic reactions; (i.e.: anaphylactic reaction); and/or death. Furthermore, the patient was informed of those risks and complications associated with the medications. These include, but are not limited to: allergic reactions (i.e.: anaphylactic or anaphylactoid reaction(s)); adrenal axis suppression; blood sugar elevation that in diabetics may result in ketoacidosis or comma; water retention that in patients with history of congestive heart failure may result in shortness of breath, pulmonary edema, and decompensation with resultant heart failure; weight gain; swelling or edema; medication-induced neural toxicity; particulate matter embolism and blood vessel occlusion with resultant organ, and/or nervous system infarction; and/or aseptic necrosis of one or more joints. Finally, the patient was informed that Medicine is not an exact science; therefore, there is also the possibility of unforeseen or unpredictable risks and/or possible complications that may result in a catastrophic outcome. The patient indicated having understood very clearly. We have given the patient no guarantees and we have made no promises. Enough time was given to the patient to ask questions, all of which were answered to the patient's satisfaction. Cynthia Mcpherson has indicated that she wanted to continue with the procedure. Attestation: I, the ordering provider, attest that I have discussed with the patient the benefits, risks, side-effects, alternatives, likelihood of  achieving goals, and potential problems during recovery for the procedure that I have provided informed consent. Date   Time: 01/06/2019  9:08 AM  Pre-Procedure Preparation:  Monitoring: As per clinic protocol. Respiration, ETCO2, SpO2, BP, heart rate and rhythm monitor placed and checked for adequate function Safety Precautions: Patient was assessed for positional comfort and pressure points before starting the procedure. Time-out: I initiated and conducted the "Time-out" before starting the procedure, as per protocol. The patient was asked to participate by confirming the accuracy of the "Time Out" information. Verification of the correct person, site, and procedure were performed and confirmed by me, the nursing staff, and the patient. "Time-out" conducted as per Joint Commission's Universal Protocol (UP.01.01.01). Time: 0951  Description of Procedure:          Laterality: Left Levels:  L2, L3, L4, L5, & S1 Medial Branch Level(s) Area Prepped: Posterior Lumbosacral Region Prepping solution: DuraPrep (Iodine Povacrylex [0.7% available iodine] and Isopropyl Alcohol, 74% w/w) Safety Precautions: Aspiration looking for blood return was conducted prior to all injections. At no point did we inject any substances, as a needle was being advanced. Before injecting, the patient was told to immediately notify me if she was experiencing any new onset of "ringing in the ears, or metallic taste in the mouth". No attempts were made at seeking any paresthesias. Safe injection practices and needle disposal techniques used. Medications properly checked for expiration dates. SDV (single dose vial) medications used. After the completion of the procedure, all disposable equipment used was discarded in the proper designated medical waste containers. Local Anesthesia: Protocol guidelines were followed. The patient was positioned over the fluoroscopy table. The area was prepped in the usual manner. The time-out was  completed. The target area was identified using fluoroscopy. A 12-in long, straight, sterile hemostat was used with fluoroscopic guidance to locate the targets for each level blocked. Once located, the skin was marked with an approved surgical skin marker.  Once all sites were marked, the skin (epidermis, dermis, and hypodermis), as well as deeper tissues (fat, connective tissue and muscle) were infiltrated with a small amount of a short-acting local anesthetic, loaded on a 10cc syringe with a 25G, 1.5-in  Needle. An appropriate amount of time was allowed for local anesthetics to take effect before proceeding to the next step. Local Anesthetic: Lidocaine 2.0% The unused portion of the local anesthetic was discarded in the proper designated containers. Technical explanation of process:  L2 Medial Branch Nerve Block (MBB): The target area for the L2 medial branch is at the junction of the postero-lateral aspect of the superior articular process and the superior, posterior, and medial edge of the transverse process of L3. Under fluoroscopic guidance, a Quincke needle was inserted until contact was made with os over the superior postero-lateral aspect of the pedicular shadow (target area). After negative aspiration for blood, 0.5 mL of the nerve block solution was injected without difficulty or complication. The needle was removed intact. L3 Medial Branch Nerve Block (MBB): The target area for the L3 medial branch is at the junction of the postero-lateral aspect of the superior articular process and the superior, posterior, and medial edge of the transverse process of L4. Under fluoroscopic guidance, a Quincke needle was inserted until contact was made with os over the superior postero-lateral aspect of the pedicular shadow (target area). After negative aspiration for blood, 0.5 mL of the nerve block solution was injected without difficulty or complication. The needle was removed intact. L4 Medial Branch Nerve Block  (MBB): The target area for the L4 medial branch is at the junction of the postero-lateral aspect of the superior articular process and the superior, posterior, and medial edge of the transverse process of L5. Under fluoroscopic guidance, a Quincke needle was inserted until contact was made with os over the superior postero-lateral aspect of the pedicular shadow (target area). After negative aspiration for blood, 0.5 mL of the nerve block solution was injected without difficulty or complication. The needle was removed intact. L5 Medial Branch Nerve Block (MBB): The target area for the L5 medial branch is at the junction of the postero-lateral aspect of the superior articular process and the superior, posterior, and medial edge of the sacral ala. Under fluoroscopic guidance, a Quincke needle was inserted until contact was made with os over the superior postero-lateral aspect of the pedicular shadow (target area). After negative aspiration for blood, 0.5 mL of the nerve block solution was injected without difficulty or complication. The needle was removed intact. S1 Medial Branch Nerve Block (MBB): The target area for the S1 medial branch is at the posterior and inferior 6 o'clock position of the L5-S1 facet joint. Under fluoroscopic guidance, the Quincke needle inserted for the L5 MBB was redirected until contact was made with os over the inferior and postero aspect of the sacrum, at the 6 o' clock position under the L5-S1 facet joint (Target area). After negative aspiration for blood, 0.5 mL of the nerve block solution was injected without difficulty or complication. The needle was removed intact.  Nerve block solution: 0.2% PF-Ropivacaine + Triamcinolone (40 mg/mL) diluted to a final concentration of 4 mg of Triamcinolone/mL of Ropivacaine The unused portion of the solution was discarded in the proper designated containers. Procedural Needles: 22-gauge, 3.5-inch, Quincke needles used for all levels.  Once the  entire procedure was completed, the treated area was cleaned, making sure to leave some of the prepping solution back to take advantage of  its long term bactericidal properties.   Illustration of the posterior view of the lumbar spine and the posterior neural structures. Laminae of L2 through S1 are labeled. DPRL5, dorsal primary ramus of L5; DPRS1, dorsal primary ramus of S1; DPR3, dorsal primary ramus of L3; FJ, facet (zygapophyseal) joint L3-L4; I, inferior articular process of L4; LB1, lateral branch of dorsal primary ramus of L1; IAB, inferior articular branches from L3 medial branch (supplies L4-L5 facet joint); IBP, intermediate branch plexus; MB3, medial branch of dorsal primary ramus of L3; NR3, third lumbar nerve root; S, superior articular process of L5; SAB, superior articular branches from L4 (supplies L4-5 facet joint also); TP3, transverse process of L3.  Vitals:   01/06/19 1013 01/06/19 1016 01/06/19 1019 01/06/19 1028  BP:   125/63 (!) (P) 108/58  Pulse:      Resp:   12 (P) 12  Temp:      SpO2: (!) 87% 100% 100% (P) 100%  Weight:      Height:         Start Time: 0951 hrs. End Time: 0957 hrs.  Imaging Guidance (Spinal):          Type of Imaging Technique: Fluoroscopy Guidance (Spinal) Indication(s): Assistance in needle guidance and placement for procedures requiring needle placement in or near specific anatomical locations not easily accessible without such assistance. Exposure Time: Please see nurses notes. Contrast: None used. Fluoroscopic Guidance: I was personally present during the use of fluoroscopy. "Tunnel Vision Technique" used to obtain the best possible view of the target area. Parallax error corrected before commencing the procedure. "Direction-depth-direction" technique used to introduce the needle under continuous pulsed fluoroscopy. Once target was reached, antero-posterior, oblique, and lateral fluoroscopic projection used confirm needle placement in all  planes. Images permanently stored in EMR. Interpretation: No contrast injected. I personally interpreted the imaging intraoperatively. Adequate needle placement confirmed in multiple planes. Permanent images saved into the patient's record.  Antibiotic Prophylaxis:   Anti-infectives (From admission, onward)   None     Indication(s): None identified  Post-operative Assessment:  Post-procedure Vital Signs:  Pulse/HCG Rate: (!) 101(P) 78 Temp: (!) 97.2 F (36.2 C) Resp: (P) 12 BP: (!) (P) 108/58 SpO2: (P) 100 %  EBL: None  Complications: No immediate post-treatment complications observed by team, or reported by patient.  Note: The patient tolerated the entire procedure well. A repeat set of vitals were taken after the procedure and the patient was kept under observation following institutional policy, for this type of procedure. Post-procedural neurological assessment was performed, showing return to baseline, prior to discharge. The patient was provided with post-procedure discharge instructions, including a section on how to identify potential problems. Should any problems arise concerning this procedure, the patient was given instructions to immediately contact us, at any time, without hesitation. In any case, we plan to contact the patient by telephone for a follow-up status report regarding this interventional procedure.  Comments:  No additional relevant information.  Plan of Care  Orders:  Orders Placed This Encounter  Procedures   L-FCT Blk (Today)    Scheduling Instructions:     Procedure: Lumbar facet block (AKA.: Lumbosacral medial branch nerve block)     Side: Left-sided     Level: L3-4, L4-5, & L5-S1 Facets (L2, L3, L4, L5, & S1 Medial Branch Nerves)     Sedation: Patient's choice.     Timeframe: Today    Order Specific Question:   Where will this procedure be performed?  Answer:   [facility]   Fluoro (C-Arm) (<60 min) (No Report)    Intraoperative  interpretation by procedural physician at [place].    Standing Status:   Standing    Number of Occurrences:   1    Order Specific Question:   Reason for exam:    Answer:   Assistance in needle guidance and placement for procedures requiring needle placement in or near specific anatomical locations not easily accessible without such assistance.   Consent: L-FCT BLK    Nursing Order: Transcribe to consent form and obtain patient signature. Note: Always confirm laterality of pain with Ms. Torres, before procedure. Procedure: Lumbar Facet Block  under fluoroscopic guidance Indication/Reason: Low Back Pain, with our without leg pain, due to Facet Joint Arthralgia (Joint Pain) known as Lumbar Facet Syndrome, secondary to Lumbar, and/or Lumbosacral Spondylosis (Arthritis of the Spine), without myelopathy or radiculopathy (Nerve Damage). Provider Attestation: I, Wineglass Dossie Arbour, MD, (Pain Management Specialist), the physician/practitioner, attest that I have discussed with the patient the benefits, risks, side effects, alternatives, likelihood of achieving goals and potential problems during recovery for the procedure that I have provided informed consent.   Block Tray    Equipment required: Single use, disposable, "Block Tray"    Standing Status:   Standing    Number of Occurrences:   1    Order Specific Question:   Specify    Answer:   Block Tray   Allergy: CONTRAST    Standing Status:   Standing    Number of Occurrences:   1   Allergy: IODINE / Shellfish    NOTE: Although It is true that patients can have allergies to shellfish and that shellfish contain iodine, most shellfish  allergies are due to two protein allergens present in the shellfish: tropomyosins and parvalbumin. Not all patients with shellfish allergies are allergic to iodine. However, as a precaution, avoid using iodine containing products.    Standing Status:   Standing    Number of Occurrences:   1    Allergy: LATEX    Activate Latex-Free Protocol.    Standing Status:   Standing    Number of Occurrences:   1   Chronic Opioid Analgesic:  No opioid analgesics prescribed by our practice.   Medications ordered for procedure: Meds ordered this encounter  Medications   lidocaine (XYLOCAINE) 2 % (with pres) injection 400 mg   lactated ringers infusion 1,000 mL   midazolam (VERSED) 5 MG/5ML injection 1-2 mg    Make sure Flumazenil is available in the pyxis when using this medication. If oversedation occurs, administer 0.2 mg IV over 15 sec. If after 45 sec no response, administer 0.2 mg again over 1 min; may repeat at 1 min intervals; not to exceed 4 doses (1 mg)   fentaNYL (SUBLIMAZE) injection 25-50 mcg    Make sure Narcan is available in the pyxis when using this medication. In the event of respiratory depression (RR< 8/min): Titrate NARCAN (naloxone) in increments of 0.1 to 0.2 mg IV at 2-3 minute intervals, until desired degree of reversal.   ropivacaine (PF) 2 mg/mL (0.2%) (NAROPIN) injection 9 mL   triamcinolone acetonide (KENALOG-40) injection 40 mg   diphenhydrAMINE (BENADRYL) injection 25 mg   Medications administered: We administered lidocaine, lactated ringers, midazolam, fentaNYL, ropivacaine (PF) 2 mg/mL (0.2%), and triamcinolone acetonide.  See the medical record for exact dosing, route, and time of administration.  Follow-up plan:   Return in about 2 weeks (around  01/20/2019) for (VV), (PP).       Interventional management options: Procedure(s) under consideration:  Diagnostic left-sided lumbar facet block #1 under fluoroscopic guidance and IV sedation  Possible left-sided lumbar facet RFA     Recent Visits Date Type Provider Dept  12/29/18 Office Visit Milinda Pointer, MD [facility]  Showing recent visits within past 90 days and meeting all other requirements   Today's Visits Date Type Provider Dept  01/06/19 Procedure visit Milinda Pointer,  MD [facility]  Showing today's visits and meeting all other requirements   Future Appointments Date Type Provider Dept  01/24/19 Appointment Milinda Pointer, MD [facility]  Showing future appointments within next 90 days and meeting all other requirements   Disposition: Discharge home  Discharge Date & Time: 01/06/2019; 1025 hrs.   Primary Care Physician: Jodelle Green, FNP Location: [facility] Note by: Gaspar Cola, MD Date: 01/06/2019; Time: 10:54 AM  Disclaimer:  Medicine is not an exact science. The only guarantee in medicine is that nothing is guaranteed. It is important to note that the decision to proceed with this intervention was based on the information collected from the patient. The Data and conclusions were drawn from the patient's questionnaire, the interview, and the physical examination. Because the information was provided in large part by the patient, it cannot be guaranteed that it has not been purposely or unconsciously manipulated. Every effort has been made to obtain as much relevant data as possible for this evaluation. It is important to note that the conclusions that lead to this procedure are derived in large part from the available data. Always take into account that the treatment will also be dependent on availability of resources and existing treatment guidelines, considered by other Pain Management Practitioners as being common knowledge and practice, at the time of the intervention. For Medico-Legal purposes, it is also important to point out that variation in procedural techniques and pharmacological choices are the acceptable norm. The indications, contraindications, technique, and results of the above procedure should only be interpreted and judged by a Board-Certified Interventional Pain Specialist with extensive familiarity and expertise in the same exact procedure and technique.

## 2019-01-07 ENCOUNTER — Telehealth: Payer: Self-pay

## 2019-01-07 NOTE — Telephone Encounter (Signed)
Post procedure phone call.  LM 

## 2019-01-17 ENCOUNTER — Telehealth: Payer: Self-pay

## 2019-01-17 NOTE — Telephone Encounter (Signed)
She wants a nurse to call her today please

## 2019-01-17 NOTE — Telephone Encounter (Signed)
Spoke with patient, she was concerned as to when next appt was, informed that it is 01/24/19 and that this would be a virtual visit.  States she does not have a smart phone and he would just need to call her on the phone.  I told her I would make a note of this in her chart.

## 2019-01-20 ENCOUNTER — Encounter: Payer: Self-pay | Admitting: Pain Medicine

## 2019-01-20 NOTE — Progress Notes (Signed)
Pain relief after procedure (treated area only): (Questions asked to patient) 1. Starting about 15 minutes after the procedure, and "while the area was still numb" (from the local anesthetics), were you having any of your usual pain "in that area" (the treated area)?  (NOTE: NOT including the discomfort from the needle sticks.) First 1 hour: 100 % better. First 4-6 hours:100  % better. 2. Assuming that it did get numb. How long was the area numb? 100 % benefit, longer than 6 hours. How long? 1 day. 3. How much better is your pain now, when compared to before the procedure? Current benefit: 0 % better. 4. Can you move better now? Improvement in ROM (Range of Motion): No. 5. Can you do more now? Improvement in function: No. 4. Did you have any problems with the procedure? Side-effects/Complications: No.

## 2019-01-24 ENCOUNTER — Other Ambulatory Visit: Payer: Self-pay

## 2019-01-24 ENCOUNTER — Ambulatory Visit: Payer: Federal, State, Local not specified - PPO | Attending: Pain Medicine | Admitting: Pain Medicine

## 2019-01-24 DIAGNOSIS — M47816 Spondylosis without myelopathy or radiculopathy, lumbar region: Secondary | ICD-10-CM

## 2019-01-24 DIAGNOSIS — G894 Chronic pain syndrome: Secondary | ICD-10-CM | POA: Diagnosis not present

## 2019-01-24 DIAGNOSIS — G8929 Other chronic pain: Secondary | ICD-10-CM

## 2019-01-24 DIAGNOSIS — M7918 Myalgia, other site: Secondary | ICD-10-CM

## 2019-01-24 DIAGNOSIS — M25552 Pain in left hip: Secondary | ICD-10-CM | POA: Diagnosis not present

## 2019-01-24 DIAGNOSIS — M545 Low back pain: Secondary | ICD-10-CM

## 2019-01-24 DIAGNOSIS — M961 Postlaminectomy syndrome, not elsewhere classified: Secondary | ICD-10-CM | POA: Diagnosis not present

## 2019-01-24 DIAGNOSIS — M5416 Radiculopathy, lumbar region: Secondary | ICD-10-CM

## 2019-01-24 MED ORDER — CYCLOBENZAPRINE HCL 10 MG PO TABS: 10.0000 mg | ORAL_TABLET | Freq: Three times a day (TID) | ORAL | 5 refills | Status: DC | PRN

## 2019-01-24 NOTE — Progress Notes (Signed)
Pain Management Virtual Encounter Note - Virtual Visit via Telephone Telehealth (real-time audio visits between healthcare provider and patient).   Patient's Phone No. & Preferred Pharmacy:  [phone] (home); [phone] (mobile); (Preferred) [phone] No e-mail address on record  [id number] Lorina Rabon, [place] [address] [place] [address] Phone: [phone] Fax: [phone]    Pre-screening note:  Our staff contacted Cynthia Mcpherson and offered her an "in person", "face-to-face" appointment versus a telephone encounter. She indicated preferring the telephone encounter, at this time.   Reason for Virtual Visit: COVID-19*  Social distancing based on CDC and AMA recommendations.   I contacted Cynthia Mcpherson on 01/24/2019 via telephone.      I clearly identified myself as Gaspar Cola, MD. I verified that I was speaking with the correct person using two identifiers (Name: Cynthia Mcpherson, and date of birth: 1960-08-28).  Advanced Informed Consent I sought verbal advanced consent from Cynthia Mcpherson for virtual visit interactions. I informed Cynthia Mcpherson of possible security and privacy concerns, risks, and limitations associated with providing "not-in-person" medical evaluation and management services. I also informed Cynthia Mcpherson of the availability of "in-person" appointments. Finally, I informed her that there would be a charge for the virtual visit and that she could be  personally, fully or partially, financially responsible for it. Cynthia Mcpherson expressed understanding and agreed to proceed.   Historic Elements   Cynthia Mcpherson is a 58 y.o. year old, female patient evaluated today after her last encounter by our practice on 01/17/2019. Cynthia Mcpherson  has a past medical history of Thyroid disease. She also  has a past surgical history that includes Back surgery. Cynthia Mcpherson has a current medication list which includes the  following prescription(s): acetaminophen, cyclobenzaprine, esomeprazole, and tirosint. She  reports that she has never smoked. She has never used smokeless tobacco. She reports previous alcohol use. She reports that she does not use drugs. Cynthia Mcpherson is allergic to influenza vaccines; iodinated diagnostic agents; latex; and shellfish allergy.   HPI  Today, she is being contacted for a post-procedure assessment.  Evaluating her low back pain is rather difficult since it appears to be intermittent.  However, she has noticed that the average duration of those episodes has dropped from an average of about 5 days to approximately 2 days.  For now, she seems to be doing okay in whenever she gets these episodes she treats them with Flexeril.  We talked about her medications today and I have provided her with a prescription for Flexeril that should last her for approximately 6 months.  She is not using the medication on a daily basis, only PRN.  Today I also went over the long-term plan and I entered a PRN order for a repeat left lumbar facet block, if the pain worsens.  If she again gets 100% relief of the pain for the duration of the local anesthetic, she may be a good candidate for radiofrequency ablation.  Post-Procedure Evaluation  Procedure: Diagnostic left-sided lumbar facet block #1 under fluoroscopic guidance and IV sedation Pre-procedure pain level:  2/10 Post-procedure: 0/10 (100% relief)  Sedation: Sedation provided.  Cynthia Martins, RN  01/20/2019  2:16 PM  Sign when Signing Visit Pain relief after procedure (treated area only): (Questions asked to patient) 1. Starting about 15 minutes after the procedure, and "while the area was still numb" (from the local anesthetics), were you having any of your usual pain "in that area" (the treated area)?  (  NOTE: NOT including the discomfort from the needle sticks.) First 1 hour: 100 % better. First 4-6 hours:100 % better. 2. Assuming that it did get  numb. How long was the area numb? 100 % benefit, longer than 6 hours. How long? 1 day. 3. How much better is your pain now, when compared to before the procedure? Current benefit: >50 % better. 4. Can you move better now? Improvement in ROM (Range of Motion): No. 5. Can you do more now? Improvement in function: No. 4. Did you have any problems with the procedure? Side-effects/Complications: No.   Current benefits: Defined as benefit that persist at this time.   Analgesia:  >50% relief.  The patient indicates that it is difficult to say, but she has noticed that her intermittent episodes of the low back pain have shortened from a duration of approximately 5 days to where they now last about 2 days.  She considers this to be a significant improvement. Function: Somewhat improved ROM: Somewhat improved  Pharmacotherapy Assessment  Analgesic: No opioid analgesics prescribed by our practice.   Monitoring: Pharmacotherapy: No side-effects or adverse reactions reported. [place] PMP: PDMP reviewed during this encounter.       Compliance: No problems identified. Effectiveness: Clinically acceptable. Plan: Refer to "POC".  UDS: No results found for: SUMMARY Laboratory Chemistry Profile (12 mo)  Renal: 11/11/2018: BUN 14; Creatinine, Ser 0.63  Lab Results  Component Value Date   GFR 96.49 11/11/2018   Hepatic: 11/11/2018: Albumin 4.6 Lab Results  Component Value Date   AST 22 11/11/2018   ALT 27 11/11/2018   Other: No results found for requested labs within last 8760 hours. Note: Above Lab results reviewed.  Imaging  Fluoro (C-Arm) (<60 min) (No Report) Fluoro was used, but no Radiologist interpretation will be provided.  Please refer to "NOTES" tab for provider progress note.   Assessment  The primary encounter diagnosis was Chronic low back pain (Primary Area of Pain) (Left) w/o sciatica. Diagnoses of Chronic pain syndrome, Failed back surgical syndrome, Chronic hip pain (Left),  Lumbar facet syndrome (Left), Lumbar radiculopathy, and Chronic musculoskeletal pain were also pertinent to this visit.  Plan of Care  Problem-specific:  No problem-specific Assessment & Plan notes found for this encounter.  I have discontinued Aaminah-Lourdes Mcpherson's predniSONE. I am also having her maintain her Tirosint, esomeprazole, acetaminophen, and cyclobenzaprine.  Pharmacotherapy (Medications Ordered): Meds ordered this encounter  Medications  . cyclobenzaprine (FLEXERIL) 10 MG tablet    Sig: Take 1 tablet (10 mg total) by mouth 3 (three) times daily as needed for muscle spasms.    Dispense:  90 tablet    Refill:  5    Fill one day early if pharmacy is closed on scheduled refill date. May substitute for generic if available.   Orders:  Orders Placed This Encounter  Procedures  . L-FCT Blk (Schedule)    Standing Status:   Future    Standing Expiration Date:   02/24/2019    Scheduling Instructions:     Procedure: Lumbar facet block (AKA.: Lumbosacral medial branch nerve block)     Side: Bilateral     Level: L3-4, L4-5, & L5-S1 Facets (L2, L3, L4, L5, & S1 Medial Branch Nerves)     Sedation: Patient's choice.     Timeframe: ASAA    Order Specific Question:   Where will this procedure be performed?    Answer:   [facility]   Follow-up plan:   Return in about 6 months (around  07/20/2019) for (VV), (MM), in addition, PRN Procedure(s): (B) L-FCT BLK #2, (w/ Sedation).      Interventional management options: Procedure(s) under consideration:  Diagnostic left-sided lumbar facet block #2 under fluoroscopic guidance and IV sedation  Possible left-sided lumbar facet RFA     Recent Visits Date Type Provider Dept  01/06/19 Procedure visit Milinda Pointer, MD [facility]  12/29/18 Office Visit Milinda Pointer, MD [facility]  Showing recent visits within past 90 days and meeting all other requirements   Today's Visits Date Type Provider Dept   01/24/19 Telemedicine Milinda Pointer, MD [facility]  Showing today's visits and meeting all other requirements   Future Appointments No visits were found meeting these conditions.  Showing future appointments within next 90 days and meeting all other requirements   I discussed the assessment and treatment plan with the patient. The patient was provided an opportunity to ask questions and all were answered. The patient agreed with the plan and demonstrated an understanding of the instructions.  Patient advised to call back or seek an in-person evaluation if the symptoms or condition worsens.  Total duration of non-face-to-face encounter: 15 minutes.  Note by: Gaspar Cola, MD Date: 01/24/2019; Time: 2:00 PM  Note: This dictation was prepared with Dragon dictation. Any transcriptional errors that may result from this process are unintentional.  Disclaimer:  * Given the special circumstances of the COVID-19 pandemic, the federal government has announced that the Office for Civil Rights (OCR) will exercise its enforcement discretion and will not impose penalties on physicians using telehealth in the event of noncompliance with regulatory requirements under the [place] (HIPAA) in connection with the good faith provision of telehealth during the XX123456 national public health emergency. ([place])

## 2019-01-24 NOTE — Patient Instructions (Signed)

## 2019-06-08 ENCOUNTER — Telehealth: Payer: Self-pay | Admitting: Family Medicine

## 2019-06-08 NOTE — Telephone Encounter (Signed)
Patient called today to est care with you. She stated that you were recommended by some family/friends   Are you okay seeing her as a new patient?

## 2019-06-08 NOTE — Telephone Encounter (Signed)
Was it family member or friend? I'm trying to close my practice except for family members of current patients.

## 2019-06-23 ENCOUNTER — Other Ambulatory Visit: Payer: Self-pay

## 2019-06-23 ENCOUNTER — Ambulatory Visit: Payer: Federal, State, Local not specified - PPO | Admitting: Nurse Practitioner

## 2019-06-23 ENCOUNTER — Encounter: Payer: Self-pay | Admitting: Nurse Practitioner

## 2019-06-23 VITALS — BP 110/80 | HR 94 | Temp 97.6°F | Ht 60.0 in | Wt 128.0 lb

## 2019-06-23 DIAGNOSIS — G8929 Other chronic pain: Secondary | ICD-10-CM

## 2019-06-23 DIAGNOSIS — E039 Hypothyroidism, unspecified: Secondary | ICD-10-CM

## 2019-06-23 DIAGNOSIS — M545 Low back pain, unspecified: Secondary | ICD-10-CM

## 2019-06-23 DIAGNOSIS — E785 Hyperlipidemia, unspecified: Secondary | ICD-10-CM

## 2019-06-23 DIAGNOSIS — Z0000 Encounter for general adult medical examination without abnormal findings: Secondary | ICD-10-CM

## 2019-06-23 NOTE — Progress Notes (Signed)
Established Patient Office Visit  Subjective:  Patient ID: Cynthia Mcpherson, female    DOB: Sep 28, 1960  Age: 59 y.o. MRN: [id number]  CC:  Chief Complaint  Patient presents with  . Transitions Of Care    discuss labs    HPI Cynthia Mcpherson Monday is a 60 year old with past medical history hypothyroidism, chronic back pain, HLD followed by endocrinology, pain management who presents to establish care with a new provider.  She established care with the Cynthia Mcpherson on 11/11/2018. She moved here from [place] in 2018.  Her main concern today is to get routine laboratory studies done.  She takes daily Flexeril and request to have her liver and kidney function study done. Ms. Mcpherson Monday comes in today with a Spanish interpreter.  She does understand and speak [place], but requests interpreter for more complex conversation.  she is due for a PAP test, mammogram,.    Immunizations: Covid vaccine 05/28/2019 and 06/05/2019 Pfizer Diet: Healthy but continues improvement Exercise: Lower back pain, GERD disease, sees pains management, walks Dental: Up-to-date Vision: Cannot recall last eye exam Colonoscopy: Reports colonoscopy in [place], does not know the details, thinks it was normal.  She says she will never have another one. Dexa: Pap Smear: due Mammogram: Done in 2019 overdue due   Past Medical History:  Diagnosis Date  . Thyroid disease    hypothyroidism     Past Surgical History:  Procedure Laterality Date  . BACK SURGERY      Family History  Problem Relation Age of Onset  . Psoriasis Mother   . Multiple sclerosis Sister   . Stroke Sister   . Hypertension Brother     Social History   Socioeconomic History  . Marital status: Married    Spouse name: Not on file  . Number of children: Not on file  . Years of education: Not on file  . Highest education level: Not on file  Occupational History  . Not on file  Tobacco Use  . Smoking status: Never Smoker  . Smokeless tobacco: Never  Used  Substance and Sexual Activity  . Alcohol use: Not Currently  . Drug use: Never  . Sexual activity: Not on file  Other Topics Concern  . Not on file  Social History Narrative   Married lives with her husband, has 1 grown daughter.  Retired Marine scientist.  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    Social Determinants of Health   Financial Resource Strain:   . Difficulty of Paying Living Expenses:   Food Insecurity:   . Worried About Charity fundraiser in the Last Year:   . Arboriculturist in the Last Year:   Transportation Needs:   . Film/video editor (Medical):   Marland Kitchen Lack of Transportation (Non-Medical):   Physical Activity:   . Days of Exercise per Week:   . Minutes of Exercise per Session:   Stress:   . Feeling of Stress :   Social Connections:   . Frequency of Communication with Friends and Family:   . Frequency of Social Gatherings with Friends and Family:   . Attends Religious Services:   . Active Member of Clubs or Organizations:   . Attends Archivist Meetings:   Marland Kitchen Marital Status:   Intimate Partner Violence:   . Fear of Current or Ex-Partner:   . Emotionally Abused:   Marland Kitchen Physically Abused:   . Sexually Abused:     Outpatient Medications Prior to Visit  Medication Sig Dispense Refill  .  acetaminophen (TYLENOL) 500 MG tablet Take 500 mg by mouth every 6 (six) hours as needed.    . cyclobenzaprine (FLEXERIL) 10 MG tablet Take 1 tablet (10 mg total) by mouth 3 (three) times daily as needed for muscle spasms. 90 tablet 5  . esomeprazole (NEXIUM) 40 MG capsule Take 40 mg by mouth 2 (two) times daily as needed.    Marland Kitchen levothyroxine (SYNTHROID) 88 MCG tablet Take 88 mcg by mouth daily.    Marland Kitchen omeprazole (PRILOSEC) 20 MG capsule Take by mouth.    . Levothyroxine Sodium (TIROSINT) 75 MCG CAPS Take 1 capsule (75 mcg total) by mouth daily before breakfast. 90 capsule 1   No facility-administered medications prior to visit.    Allergies  Allergen Reactions  . Influenza  Vaccines Hives and Rash  . Iodinated Diagnostic Agents     Rash and breathing problems  . Latex Hives and Rash  . Shellfish Allergy Hives and Rash    ROS Review of Systems  Constitutional: Negative for fever.  HENT: Negative for congestion.   Respiratory: Negative for cough and shortness of breath.   Cardiovascular: Negative for chest pain, palpitations and leg swelling.  Gastrointestinal: Negative.  Negative for blood in stool.  Neurological: Negative for dizziness, seizures and headaches.  Psychiatric/Behavioral:       No depression and anxiety      Objective:    Physical Exam  Constitutional: She is oriented to person, place, and time. She appears well-developed and well-nourished.  HENT:  Head: Normocephalic and atraumatic.  Cardiovascular: Normal rate and regular rhythm.  Pulmonary/Chest: Effort normal and breath sounds normal.  Abdominal: Soft. Bowel sounds are normal.  Musculoskeletal:        General: Normal range of motion.     Cervical back: Normal range of motion.  Neurological: She is alert and oriented to person, place, and time.  Skin: Skin is warm and dry.  Psychiatric: She has a normal mood and affect. Her behavior is normal. Judgment and thought content normal.  Vitals reviewed.   BP 110/80 (BP Location: Left Arm, Patient Position: Sitting, Cuff Size: Small)   Pulse 94   Temp 97.6 F (36.4 C) (Skin)   Ht 5' (1.524 m)   Wt 128 lb (58.1 kg)   SpO2 98%   BMI 25.00 kg/m  Wt Readings from Last 3 Encounters:  06/23/19 128 lb (58.1 kg)  01/06/19 120 lb (54.4 kg)  12/29/18 120 lb (54.4 kg)     Health Maintenance Due  Topic Date Due  . Hepatitis C Screening  Never done  . HIV Screening  Never done  . TETANUS/TDAP  Never done  . PAP SMEAR-Modifier  Never done  . COLONOSCOPY  Never done    There are no preventive care reminders to display for this patient.  Lab Results  Component Value Date   TSH 0.17 (L) 11/11/2018   Lab Results  Component  Value Date   WBC 6.1 11/11/2018   HGB 13.9 11/11/2018   HCT 42.0 11/11/2018   MCV 84.4 11/11/2018   PLT 474.0 (H) 11/11/2018   Lab Results  Component Value Date   NA 140 11/11/2018   K 3.8 11/11/2018   CO2 32 11/11/2018   GLUCOSE 107 (H) 11/11/2018   BUN 14 11/11/2018   CREATININE 0.63 11/11/2018   BILITOT 0.3 11/11/2018   ALKPHOS 81 11/11/2018   AST 22 11/11/2018   ALT 27 11/11/2018   PROT 7.3 11/11/2018   ALBUMIN 4.6 11/11/2018  CALCIUM 10.6 (H) 11/11/2018   GFR 96.49 11/11/2018   Lab Results  Component Value Date   CHOL 255 (H) 11/11/2018   Lab Results  Component Value Date   HDL 43.60 11/11/2018   No results found for: [facility] Lab Results  Component Value Date   TRIG 273.0 (H) 11/11/2018   Lab Results  Component Value Date   CHOLHDL 6 11/11/2018   No results found for: HGBA1C    Assessment & Plan:   Problem List Items Addressed This Visit      Endocrine   Acquired hypothyroidism   Relevant Medications   levothyroxine (SYNTHROID) 88 MCG tablet     Other   Chronic low back pain (Primary Area of Pain) (Left) w/o sciatica (Chronic)   Encounter for screening and preventative care - Primary   Relevant Orders   Ambulatory referral to Obstetrics / Gynecology   CBC with Differential/Platelet   Comprehensive metabolic panel   Lipid panel   HIV antibody (with reflex)   Hepatitis C Antibody   Hyperlipidemia      I have ordered routine preventative healthcare labs.  I have placed referral for gynecology to perform the Pap test and breast exam and order mammogram.  If you change your mind and decide to do colon cancer screening let us know.  I would order Cologuard for you since you do not want to do another colonoscopy.  Continue to follow with your thyroid doctor.  Please make an appointment to get a routine eye exam- [place]   Follow-up office visit in 6 months.  No orders of the defined types were placed in this  encounter.   Follow-up: Return in about 6 months (around 12/24/2019).   This visit occurred during the SARS-CoV-2 public health emergency.  Safety protocols were in place, including screening questions prior to the visit, additional usage of staff PPE, and extensive cleaning of exam room while observing appropriate contact time as indicated for disinfecting solutions.   Denice Paradise, NP

## 2019-06-23 NOTE — Patient Instructions (Addendum)
It was nice to meet you today.  Please go to the lab after this visit.  I have ordered routine preventative healthcare labs.  I have placed referral for gynecology to perform the Pap test and breast exam and order mammogram.  If you change your mind and decide to do colon cancer screening let us know.  I would order Cologuard for you since you do not want to do another colonoscopy.  Continue to follow with your thyroid doctor.  Please make an appointment to get a routine eye exam- [place]   Follow-up office visit in 6 months. Cuidados preventivos en las mujeres de 34 a 49 aos de edad Preventive Care 33-79 Years Old, Female Los cuidados preventivos hacen referencia a las opciones en cuanto a las visitas al mdico y al estilo de vida, las cuales pueden promover la salud y Musician. Esto puede comprender lo siguiente:  Un examen fsico anual. Esto tambin se puede llamar control de bienestar anual.  Visitas regulares al dentista y exmenes oculares.  Vacunas.  Estudios para Engineer, building services.  Opciones saludables de estilo de vida, como seguir una dieta saludable, hacer ejercicio regularmente, no usar drogas ni productos que contengan nicotina y tabaco, y limitar el consumo de alcohol. Qu puedo esperar para mi visita de cuidado preventivo? Examen fsico El mdico revisar lo siguiente:  IT consultant y [place]. Esto se puede usar para calcular el ndice de masa corporal ([place]), que indica si tiene un peso saludable.  Frecuencia cardaca y presin arterial.  Piel para detectar manchas anormales. Asesoramiento Su mdico puede preguntarle acerca de:  Consumo de tabaco, alcohol y drogas.  Su bienestar emocional.  El bienestar en el hogar y sus relaciones personales.  Su actividad sexual.  Sus hbitos de alimentacin.  Su trabajo y [place] laboral.  Mtodos anticonceptivos.  Su ciclo menstrual.  Sus antecedentes de Media planner. Qu vacunas  necesito?  [place] antigripal  Se recomienda aplicarse esta vacuna todos los [place]. Vacuna contra el ttanos, difteria y tos ferina (Tdap)  Es posible que tenga que aplicarse un refuerzo contra el ttanos y la difteria (DT) cada 10aos. Vacuna contra la varicela  Es posible que tenga que aplicrsela si no recibi esta vacuna. Vacuna contra el herpes zster (culebrilla)  Es posible que la necesite despus de los 43 aos de [place]. Vacuna contra el sarampin, rubola y paperas (SRP)  Es posible que necesite aplicarse al menos una dosis de la vacuna SRP si naci despus de [id number]. Podra tambin necesitar una segunda dosis. Vacuna antineumoccica conjugada (PCV13)  Puede necesitar esta vacuna si tiene determinadas enfermedades y no se vacun anteriormente. Edward [place] antineumoccica de polisacridos (PPSV23)  Quizs tenga que aplicarse una o dos dosis si fuma o si tiene determinadas afecciones. Edward Jolly antimeningoccica conjugada (MenACWY)  Puede necesitar esta vacuna si tiene determinadas afecciones. Vacuna contra la hepatitis A  Es posible que necesite esta vacuna si tiene ciertas afecciones o si viaja o trabaja en lugares en los que podra estar expuesta a la hepatitis A. Vacuna contra la hepatitis B  Es posible que necesite esta vacuna si tiene ciertas afecciones o si viaja o trabaja en lugares en los que podra estar expuesta a la hepatitis B. Vacuna antihaemophilus influenzae tipo B (Hib)  Puede necesitar esta vacuna si tiene determinadas afecciones. Vacuna contra el virus del papiloma humano (VPH)  Si el mdico se lo recomienda, Research scientist (physical sciences) tres dosis a lo largo de 6 meses. Puede recibir las vacunas en forma de dosis  individuales o en forma de dos o ms vacunas juntas en la misma inyeccin (vacunas combinadas). Hable con su mdico [employer] y beneficios de las vacunas combinadas. Qu pruebas necesito? Anlisis de [employer] de lpidos y colesterol. Estos se  pueden verificar cada 5 aos o, con ms frecuencia, si usted tiene ms de 93 aos de edad.  Anlisis de hepatitisC.  Anlisis de hepatitisB. Pruebas de deteccin  Pruebas de deteccin de cncer de pulmn. Es posible que se le realice esta prueba de deteccin a partir de los 31 aos de edad, si ha fumado durante 30 aos un paquete diario y sigue fumando o dej el hbito en algn momento en los ltimos 15 aos.  Pruebas de Programme researcher, broadcasting/film/video de Surveyor, minerals. Todos los adultos a partir de los 86 aos de edad y [place] 63 aos de edad deben hacerse esta prueba de deteccin. El mdico puede recomendarle las pruebas de deteccin a partir de los 56 aos de edad si corre un mayor riesgo. Le realizarn pruebas cada 1 a 10 aos, segn los [place] y el tipo de prueba de Programme researcher, broadcasting/film/video.  Pruebas de deteccin de la diabetes. Esto se Set designer un control del azcar en la sangre (glucosa) despus de no haber comido durante un periodo de tiempo (ayuno). Es posible que se le realice esta prueba cada 1 a 3 aos.  Mamografa. Se puede realizar cada 1 o 2 aos. Hable con su mdico sobre cundo debe comenzar a Engineer, manufacturing de [place] regular. Esto depende de si tiene antecedentes familiares de cncer de mama o no.  Pruebas de deteccin de cncer relacionado con las mutaciones del BRCA. Es posible que se las deba realizar si tiene antecedentes de cncer de mama, de ovario, de trompas o peritoneal.  Examen plvico y prueba de Papanicolaou. Esto se puede realizar cada 47aos a Renato Gails de los 21aos de edad. A partir de los 30 aos, esto se puede Optometrist cada 5 aos si usted se realiza una prueba de Papanicolaou en combinacin con una prueba de deteccin del virus del papiloma humano (VPH). Otras pruebas  Anlisis de enfermedades de transmisin sexual (ETS).  Densitometra sea. Esto se realiza para detectar osteoporosis. Se le puede realizar este examen de deteccin si tiene un riesgo alto de tener  osteoporosis. Siga estas instrucciones en su casa: Comida y bebida  Siga una dieta que incluya frutas frescas y verduras, cereales integrales, lcteos descremados y protenas magras.  Tome los suplementos vitamnicos y [employer] se lo haya indicado el mdico.  No beba alcohol si: ? Su mdico le indica no hacerlo. ? Est embarazada, puede estar embarazada o est tratando de quedar embarazada.  Si bebe alcohol: ? Limite la cantidad que consume de 0 a 1 medida por da. ? Est atenta a la cantidad de alcohol que hay en las bebidas que toma. En los [place], una medida equivale a una botella de cerveza de 12oz (361m), un vaso de vino de 5oz (1481m o un vaso de una bebida alcohlica de alta graduacin de 1oz (4470m Estilo de vidNavistar International Corporationlas encas a diario.  Mantngase activa. Haga al menos 36m47mos de ejercicio 5o ms das Hilton Hotelso consuma ningn producto que contenga nicotina o tabaco, como cigarrillos, cigarrillos electrnicos y tabaco de mascHigher education careers adviser necesita ayuda para dejar de fumar, consulte al mdico.  Si es sexualmente activa, practique sexo seguro. Use un condn u otra forma de mtodo anticonceptivo (anticonceptivos) a  fin de evitar un embarazo e ITS (infecciones de transmisin sexual).  Si el mdico se lo indic, tome una dosis baja de aspirina diariamente a partir de los 64 aos de [place]. Cundo volver?  Visite al mdico una vez al ao para una visita de control.  Pregntele al mdico con qu frecuencia debe realizarse un control de la vista y los dientes.  Mantenga su esquema de vacunacin al da. Esta informacin no tiene Marine scientist el consejo del mdico. Asegrese de hacerle al mdico cualquier pregunta que tenga. Document Revised: 11/19/2017 Document Reviewed: 11/19/2017 Elsevier Patient Education  [place].

## 2019-06-24 ENCOUNTER — Encounter: Payer: Self-pay | Admitting: Nurse Practitioner

## 2019-06-24 DIAGNOSIS — E785 Hyperlipidemia, unspecified: Secondary | ICD-10-CM | POA: Insufficient documentation

## 2019-06-24 DIAGNOSIS — Z0000 Encounter for general adult medical examination without abnormal findings: Secondary | ICD-10-CM | POA: Insufficient documentation

## 2019-06-24 LAB — COMPREHENSIVE METABOLIC PANEL
ALT: 64 U/L — ABNORMAL HIGH (ref 0–35)
AST: 36 U/L (ref 0–37)
Albumin: 4.5 g/dL (ref 3.5–5.2)
Alkaline Phosphatase: 103 U/L (ref 39–117)
BUN: 14 mg/dL (ref 6–23)
CO2: 30 mEq/L (ref 19–32)
Calcium: 9.9 mg/dL (ref 8.4–10.5)
Chloride: 102 mEq/L (ref 96–112)
Creatinine, Ser: 0.7 mg/dL (ref 0.40–1.20)
GFR: 85.27 mL/min (ref >60.00)
Glucose, Bld: 99 mg/dL (ref 70–99)
Potassium: 4.1 mEq/L (ref 3.5–5.1)
Sodium: 137 mEq/L (ref 135–145)
Total Bilirubin: 0.3 mg/dL (ref 0.2–1.2)
Total Protein: 7.4 g/dL (ref 6.0–8.3)

## 2019-06-24 LAB — CBC WITH DIFFERENTIAL/PLATELET
Basophils Absolute: 0 10*3/uL (ref 0.0–0.1)
Basophils Relative: 0.6 % (ref 0.0–3.0)
Eosinophils Absolute: 0.1 10*3/uL (ref 0.0–0.7)
Eosinophils Relative: 1.4 % (ref 0.0–5.0)
HCT: 40 % (ref 36.0–46.0)
Hemoglobin: 13.3 g/dL (ref 12.0–15.0)
Lymphocytes Relative: 24.9 % (ref 12.0–46.0)
Lymphs Abs: 2 10*3/uL (ref 0.7–4.0)
MCHC: 33.4 g/dL (ref 30.0–36.0)
MCV: 83.4 fl (ref 78.0–100.0)
Monocytes Absolute: 0.5 10*3/uL (ref 0.1–1.0)
Monocytes Relative: 6.6 % (ref 3.0–12.0)
Neutro Abs: 5.3 10*3/uL (ref 1.4–7.7)
Neutrophils Relative %: 66.5 % (ref 43.0–77.0)
Platelets: 508 10*3/uL — ABNORMAL HIGH (ref 150.0–400.0)
RBC: 4.79 Mil/uL (ref 3.87–5.11)
RDW: 14.3 % (ref 11.5–15.5)
WBC: 8 10*3/uL (ref 4.0–10.5)

## 2019-06-24 LAB — HEPATITIS C ANTIBODY
Hepatitis C Ab: NONREACTIVE
SIGNAL TO CUT-OFF: 0.01 (ref <1.00)

## 2019-06-24 LAB — HIV ANTIBODY (ROUTINE TESTING W REFLEX): HIV 1&2 Ab, 4th Generation: NONREACTIVE

## 2019-06-24 LAB — LIPID PANEL
Cholesterol: 252 mg/dL — ABNORMAL HIGH (ref 0–200)
HDL: 35.7 mg/dL — ABNORMAL LOW (ref >39.00)
Total CHOL/HDL Ratio: 7
Triglycerides: 429 mg/dL — ABNORMAL HIGH (ref 0.0–149.0)

## 2019-06-24 LAB — LDL CHOLESTEROL, DIRECT: Direct LDL: 149 mg/dL

## 2019-06-27 ENCOUNTER — Telehealth: Payer: Self-pay | Admitting: Nurse Practitioner

## 2019-06-27 DIAGNOSIS — R7401 Elevation of levels of liver transaminase levels: Secondary | ICD-10-CM

## 2019-06-27 DIAGNOSIS — E785 Hyperlipidemia, unspecified: Secondary | ICD-10-CM

## 2019-06-27 NOTE — Telephone Encounter (Signed)
Please call her with lab results returning:  1. Elevated platelet count-  PLAN: She needs referral to Hematology to evaluate this. If she agrees, I will place a referral.    2. Lipids are elevated.  Patient has stated that she does not want to take any medication for her lipids. Let her know the non-fasting values are elevated.  PLAN: Continue to follow a low glycemic and low sugar diet.  Please mail her dietary information.  Repeat lipid panel fasting in 3 months.She needs fasting lab appt arranged.   3. Her ALT is elevated- liver lab.  PLAN: Needs to be repeated and if still elevated- will need a lab and [place] work up. If she drinks alcohol or takes Tylenol- recommend cutting back use.  She needs fasting lab appt arranged in 3 mo with lipids.

## 2019-06-28 NOTE — Telephone Encounter (Signed)
LMTCB

## 2019-06-29 NOTE — Telephone Encounter (Signed)
LMTCB

## 2019-07-07 NOTE — Telephone Encounter (Signed)
Spoke with patient this morning about below message. She agrees to hematology referral and scheduled for lab appt on 09/05/19 8:15am.

## 2019-07-20 ENCOUNTER — Ambulatory Visit: Payer: Medicare Other | Attending: Pain Medicine | Admitting: Pain Medicine

## 2019-07-20 ENCOUNTER — Other Ambulatory Visit: Payer: Self-pay

## 2019-07-20 ENCOUNTER — Encounter: Payer: Self-pay | Admitting: Pain Medicine

## 2019-07-20 VITALS — BP 105/83 | HR 104 | Temp 97.7°F | Resp 16 | Ht 60.0 in | Wt 125.0 lb

## 2019-07-20 DIAGNOSIS — M545 Low back pain: Secondary | ICD-10-CM | POA: Insufficient documentation

## 2019-07-20 DIAGNOSIS — G894 Chronic pain syndrome: Secondary | ICD-10-CM | POA: Diagnosis not present

## 2019-07-20 DIAGNOSIS — G8929 Other chronic pain: Secondary | ICD-10-CM | POA: Insufficient documentation

## 2019-07-20 DIAGNOSIS — M47816 Spondylosis without myelopathy or radiculopathy, lumbar region: Secondary | ICD-10-CM | POA: Diagnosis present

## 2019-07-20 NOTE — Patient Instructions (Addendum)
____________________________________________________________________________________________  Preparing for Procedure with Sedation  Procedure appointments are limited to planned procedures: . No Prescription Refills. . No disability issues will be discussed. . No medication changes will be discussed.  Instructions: . Oral Intake: Do not eat or drink anything for at least 8 hours prior to your procedure. (Exception: Blood Pressure Medication. See below.) . Transportation: Unless otherwise stated by your physician, you may drive yourself after the procedure. . Blood Pressure Medicine: Do not forget to take your blood pressure medicine with a sip of water the morning of the procedure. If your Diastolic (lower reading)is above 100 mmHg, elective cases will be cancelled/rescheduled. . Blood thinners: These will need to be stopped for procedures. Notify our staff if you are taking any blood thinners. Depending on which one you take, there will be specific instructions on how and when to stop it. . Diabetics on insulin: Notify the staff so that you can be scheduled 1st case in the morning. If your diabetes requires high dose insulin, take only  of your normal insulin dose the morning of the procedure and notify the staff that you have done so. . Preventing infections: Shower with an antibacterial soap the morning of your procedure. . Build-up your immune system: Take 1000 mg of Vitamin C with every meal (3 times a day) the day prior to your procedure. . Antibiotics: Inform the staff if you have a condition or reason that requires you to take antibiotics before dental procedures. . Pregnancy: If you are pregnant, call and cancel the procedure. . Sickness: If you have a cold, fever, or any active infections, call and cancel the procedure. . Arrival: You must be in the facility at least 30 minutes prior to your scheduled procedure. . Children: Do not bring children with you. . Dress appropriately:  Bring dark clothing that you would not mind if they get stained. . Valuables: Do not bring any jewelry or valuables.  Reasons to call and reschedule or cancel your procedure: (Following these recommendations will minimize the risk of a serious complication.) . Surgeries: Avoid having procedures within 2 weeks of any surgery. (Avoid for 2 weeks before or after any surgery). . Flu Shots: Avoid having procedures within 2 weeks of a flu shots or . (Avoid for 2 weeks before or after immunizations). . Barium: Avoid having a procedure within 7-10 days after having had a radiological study involving the use of radiological contrast. (Myelograms, Barium swallow or enema study). . Heart attacks: Avoid any elective procedures or surgeries for the initial 6 months after a "Myocardial Infarction" (Heart Attack). . Blood thinners: It is imperative that you stop these medications before procedures. Let us know if you if you take any blood thinner.  . Infection: Avoid procedures during or within two weeks of an infection (including chest colds or gastrointestinal problems). Symptoms associated with infections include: Localized redness, fever, chills, night sweats or profuse sweating, burning sensation when voiding, cough, congestion, stuffiness, runny nose, sore throat, diarrhea, nausea, vomiting, cold or Flu symptoms, recent or current infections. It is specially important if the infection is over the area that we intend to treat. . Heart and lung problems: Symptoms that may suggest an active cardiopulmonary problem include: cough, chest pain, breathing difficulties or shortness of breath, dizziness, ankle swelling, uncontrolled high or unusually low blood pressure, and/or palpitations. If you are experiencing any of these symptoms, cancel your procedure and contact your primary care physician for an evaluation.  Remember:  Regular Business hours are:    Monday to Thursday 8:00 AM to 4:00 PM  Provider's  Schedule: Milinda Pointer, MD:  Procedure days: Tuesday and Thursday 7:30 AM to 4:00 PM  Gillis Santa, MD:  Procedure days: Monday and Wednesday 7:30 AM to 4:00 PM ____________________________________________________________________________________________   Facet Blocks Patient Information  Description: The facets are joints in the spine between the vertebrae.  Like any joints in the body, facets can become irritated and painful.  Arthritis can also effect the facets.  By injecting steroids and local anesthetic in and around these joints, we can temporarily block the nerve supply to them.  Steroids act directly on irritated nerves and tissues to reduce selling and inflammation which often leads to decreased pain.  Facet blocks may be done anywhere along the spine from the neck to the low back depending upon the location of your pain.   After numbing the skin with local anesthetic (like Novocaine), a small needle is passed onto the facet joints under x-ray guidance.  You may experience a sensation of pressure while this is being done.  The entire block usually lasts about 15-25 minutes.   Conditions which may be treated by facet blocks:   Low back/buttock pain  Neck/shoulder pain  Certain types of headaches  Preparation for the injection:  1. Do not eat any solid food or dairy products within 8 hours of your appointment. 2. You may drink clear liquid up to 3 hours before appointment.  Clear liquids include water, black coffee, juice or soda.  No milk or cream please. 3. You may take your regular medication, including pain medications, with a sip of water before your appointment.  Diabetics should hold regular insulin (if taken separately) and take 1/2 normal NPH dose the morning of the procedure.  Carry some sugar containing items with you to your appointment. 4. A driver must accompany you and be prepared to drive you home after your procedure. 5. Bring all your current medications  with you. 6. An IV may be inserted and sedation may be given at the discretion of the physician. 7. A blood pressure cuff, EKG and other monitors will often be applied during the procedure.  Some patients may need to have extra oxygen administered for a short period. 8. You will be asked to provide medical information, including your allergies and medications, prior to the procedure.  We must know immediately if you are taking blood thinners (like Coumadin/Warfarin) or if you are allergic to IV iodine contrast (dye).  We must know if you could possible be pregnant.  Possible side-effects:   Bleeding from needle site  Infection (rare, may require surgery)  Nerve injury (rare)  Numbness & tingling (temporary)  Difficulty urinating (rare, temporary)  Spinal headache (a headache worse with upright posture)  Light-headedness (temporary)  Pain at injection site (serveral days)  Decreased blood pressure (rare, temporary)  Weakness in arm/leg (temporary)  Pressure sensation in back/neck (temporary)   Call if you experience:   Fever/chills associated with headache or increased back/neck pain  Headache worsened by an upright position  New onset, weakness or numbness of an extremity below the injection site  Hives or difficulty breathing (go to the emergency room)  Inflammation or drainage at the injection site(s)  Severe back/neck pain greater than usual  New symptoms which are concerning to you  Please note:  Although the local anesthetic injected can often make your back or neck feel good for several hours after the injection, the pain will likely return. It takes  3-7 days for steroids to work.  You may not notice any pain relief for at least one week.  If effective, we will often do a series of 2-3 injections spaced 3-6 weeks apart to maximally decrease your pain.  After the initial series, you may be a candidate for a more permanent nerve block of the facets.  If you  have any questions, please call #[phone]) [place]  What are the risk, side effects and possible complications? Generally speaking, most procedures are safe.  However, with any procedure there are risks, side effects, and the possibility of complications.  The risks and complications are dependent upon the sites that are lesioned, or the type of nerve block to be performed.  The closer the procedure is to the spine, the more serious the risks are.  Great care is taken when placing the radio frequency needles, block needles or lesioning probes, but sometimes complications can occur. 1. Infection: Any time there is an injection through the skin, there is a risk of infection.  This is why sterile conditions are used for these blocks.  There are four possible types of infection. 1. Localized skin infection. 2. Central Nervous System Infection-This can be in the form of Meningitis, which can be deadly. 3. Epidural Infections-This can be in the form of an epidural abscess, which can cause pressure inside of the spine, causing compression of the spinal cord with subsequent paralysis. This would require an emergency surgery to decompress, and there are no guarantees that the patient would recover from the paralysis. 4. Discitis-This is an infection of the intervertebral discs.  It occurs in about 1% of discography procedures.  It is difficult to treat and it may lead to surgery.        2. Pain: the needles have to go through skin and soft tissues, will cause soreness.       3. Damage to internal structures:  The nerves to be lesioned may be near blood vessels or    other nerves which can be potentially damaged.       4. Bleeding: Bleeding is more common if the patient is taking blood thinners such as  aspirin, Coumadin, Ticiid, Plavix, etc., or if he/she have some genetic predisposition  such as hemophilia. Bleeding into the spinal canal  can cause compression of the spinal  cord with subsequent paralysis.  This would require an emergency surgery to  decompress and there are no guarantees that the patient would recover from the  paralysis.       5. Pneumothorax:  Puncturing of a lung is a possibility, every time a needle is introduced in  the area of the chest or upper back.  Pneumothorax refers to free air around the  collapsed lung(s), inside of the thoracic cavity (chest cavity).  Another two possible  complications related to a similar event would include: Hemothorax and Chylothorax.   These are variations of the Pneumothorax, where instead of air around the collapsed  lung(s), you may have blood or chyle, respectively.       6. Spinal headaches: They may occur with any procedures in the area of the spine.       7. Persistent CSF (Cerebro-Spinal Fluid) leakage: This is a rare problem, but may occur  with prolonged intrathecal or epidural catheters either due to the formation of a fistulous  track or a dural tear.       8. Nerve damage: By  working so close to the spinal cord, there is always a possibility of  nerve damage, which could be as serious as a permanent spinal cord injury with  paralysis.       9. Death:  Although rare, severe deadly allergic reactions known as "Anaphylactic  reaction" can occur to any of the medications used.      10. Worsening of the symptoms:  We can always make thing worse.  What are the chances of something like this happening? Chances of any of this occuring are extremely low.  By statistics, you have more of a chance of getting killed in a motor vehicle accident: while driving to the hospital than any of the above occurring .  Nevertheless, you should be aware that they are possibilities.  In general, it is similar to taking a shower.  Everybody knows that you can slip, hit your head and get killed.  Does that mean that you should not shower again?  Nevertheless always keep in mind that statistics do not  mean anything if you happen to be on the wrong side of them.  Even if a procedure has a 1 (one) in a 1,000,000 (million) chance of going wrong, it you happen to be that one..Also, keep in mind that by statistics, you have more of a chance of having something go wrong when taking medications.  Who should not have this procedure? If you are on a blood thinning medication (e.g. Coumadin, Plavix, see list of "Blood Thinners"), or if you have an active infection going on, you should not have the procedure.  If you are taking any blood thinners, please inform your physician.  How should I prepare for this procedure?  Do not eat or drink anything at least six hours prior to the procedure.  Bring a driver with you .  It cannot be a taxi.  Come accompanied by an adult that can drive you back, and that is strong enough to help you if your legs get weak or numb from the local anesthetic.  Take all of your medicines the morning of the procedure with just enough water to swallow them.  If you have diabetes, make sure that you are scheduled to have your procedure done first thing in the morning, whenever possible.  If you have diabetes, take only half of your insulin dose and notify our nurse that you have done so as soon as you arrive at the clinic.  If you are diabetic, but only take blood sugar pills (oral hypoglycemic), then do not take them on the morning of your procedure.  You may take them after you have had the procedure.  Do not take aspirin or any aspirin-containing medications, at least eleven (11) days prior to the procedure.  They may prolong bleeding.  Wear loose fitting clothing that may be easy to take off and that you would not mind if it got stained with Betadine or blood.  Do not wear any jewelry or perfume  Remove any nail coloring.  It will interfere with some of our monitoring equipment.  NOTE: Remember that this is not meant to be interpreted as a complete list of all possible  complications.  Unforeseen problems may occur.  BLOOD THINNERS The following drugs contain aspirin or other products, which can cause increased bleeding during surgery and should not be taken for 2 weeks prior to and 1 week after surgery.  If you should need take something for relief of minor pain, you may take  acetaminophen which is found in Tylenol,m Datril, Anacin-3 and Panadol. It is not blood thinner. The products listed below are.  Do not take any of the products listed below in addition to any listed on your instruction sheet.  A.P.C or A.P.C with Codeine Codeine Phosphate Capsules #3 Ibuprofen Ridaura  ABC compound Congesprin Imuran rimadil  Advil Cope Indocin Robaxisal  Alka-Seltzer Effervescent Pain Reliever and Antacid Coricidin or Coricidin-D  Indomethacin Rufen  Alka-Seltzer plus Cold Medicine Cosprin Ketoprofen S-A-C Tablets  Anacin Analgesic Tablets or Capsules Coumadin Korlgesic Salflex  Anacin Extra Strength Analgesic tablets or capsules CP-2 Tablets Lanoril Salicylate  Anaprox Cuprimine Capsules Levenox Salocol  Anexsia-D Dalteparin Magan Salsalate  Anodynos Darvon compound Magnesium Salicylate Sine-off  Ansaid Dasin Capsules Magsal Sodium Salicylate  Anturane Depen Capsules Marnal Soma  APF Arthritis pain formula Dewitt's Pills Measurin Stanback  Argesic Dia-Gesic Meclofenamic Sulfinpyrazone  Arthritis Bayer Timed Release Aspirin Diclofenac Meclomen Sulindac  Arthritis pain formula Anacin Dicumarol Medipren Supac  Analgesic (Safety coated) Arthralgen Diffunasal Mefanamic Suprofen  Arthritis Strength Bufferin Dihydrocodeine Mepro Compound Suprol  Arthropan liquid Dopirydamole Methcarbomol with Aspirin Synalgos  ASA tablets/Enseals Disalcid Micrainin Tagament  Ascriptin Doan's Midol Talwin  Ascriptin A/D Dolene Mobidin Tanderil  Ascriptin Extra Strength Dolobid Moblgesic Ticlid  Ascriptin with Codeine Doloprin or Doloprin with Codeine Momentum Tolectin  Asperbuf Duoprin  Mono-gesic Trendar  Aspergum Duradyne Motrin or Motrin IB Triminicin  Aspirin plain, buffered or enteric coated Durasal Myochrisine Trigesic  Aspirin Suppositories Easprin Nalfon Trillsate  Aspirin with Codeine Ecotrin Regular or Extra Strength Naprosyn Uracel  Atromid-S Efficin Naproxen Ursinus  Auranofin Capsules Elmiron Neocylate Vanquish  Axotal Emagrin Norgesic Verin  Azathioprine Empirin or Empirin with Codeine Normiflo Vitamin E  Azolid Emprazil Nuprin Voltaren  Bayer Aspirin plain, buffered or children's or timed BC Tablets or powders Encaprin Orgaran Warfarin Sodium  Buff-a-Comp Enoxaparin Orudis Zorpin  Buff-a-Comp with Codeine Equegesic Os-Cal-Gesic   Buffaprin Excedrin plain, buffered or Extra Strength Oxalid   Bufferin Arthritis Strength Feldene Oxphenbutazone   Bufferin plain or Extra Strength Feldene Capsules Oxycodone with Aspirin   Bufferin with Codeine Fenoprofen Fenoprofen Pabalate or Pabalate-SF   Buffets II Flogesic Panagesic   Buffinol plain or Extra Strength Florinal or Florinal with Codeine Panwarfarin   Buf-Tabs Flurbiprofen Penicillamine   Butalbital Compound Four-way cold tablets Penicillin   Butazolidin Fragmin Pepto-Bismol   Carbenicillin Geminisyn Percodan   Carna Arthritis Reliever Geopen Persantine   Carprofen Gold's salt Persistin   Chloramphenicol Goody's Phenylbutazone   Chloromycetin Haltrain Piroxlcam   Clmetidine heparin Plaquenil   Cllnoril Hyco-pap Ponstel   Clofibrate Hydroxy chloroquine Propoxyphen         Before stopping any of these medications, be sure to consult the physician who ordered them.  Some, such as Coumadin (Warfarin) are ordered to prevent or treat serious conditions such as "deep thrombosis", "pumonary embolisms", and other heart problems.  The amount of time that you may need off of the medication may also vary with the medication and the reason for which you were taking it.  If you are taking any of these medications, please  make sure you notify your pain physician before you undergo any procedures.         Pain Management Discharge Instructions  General Discharge Instructions :  If you need to reach your doctor call: Monday-Friday 8:00 am - 4:00 pm at [phone] or toll free [phone].  After clinic hours [phone] to have operator reach doctor.  Bring all of your medication bottles to  all your appointments in the pain clinic.  To cancel or reschedule your appointment with Pain Management please remember to call 24 hours in advance to avoid a fee.  Refer to the educational materials which you have been given on: General Risks, I had my Procedure. Discharge Instructions, Post Sedation.  Post Procedure Instructions:  The drugs you were given will stay in your system until tomorrow, so for the next 24 hours you should not drive, make any legal decisions or drink any alcoholic beverages.  You may eat anything you prefer, but it is better to start with liquids then soups and crackers, and gradually work up to solid foods.  Please notify your doctor immediately if you have any unusual bleeding, trouble breathing or pain that is not related to your normal pain.  Depending on the type of procedure that was done, some parts of your body may feel week and/or numb.  This usually clears up by tonight or the next day.  Walk with the use of an assistive device or accompanied by an adult for the 24 hours.  You may use ice on the affected area for the first 24 hours.  Put ice in a Ziploc bag and cover with a towel and place against area 15 minutes on 15 minutes off.  You may switch to heat after 24 hours. Facet Joint Block The facet joints connect the bones of the spine (vertebrae). They make it possible for you to bend, twist, and make other movements with your spine. They also keep you from bending too far, twisting too far, and making other extreme movements. A facet joint block is a procedure in which  a numbing medicine (anesthetic) is injected into a facet joint. In many cases, an anti-inflammatory medicine (steroid) is also injected. A facet joint block may be done:  To diagnose neck or back pain. If the pain gets better after a facet joint block, it means the pain is probably coming from the facet joint. If the pain does not get better, it means the pain is probably not coming from the facet joint.  To relieve neck or back pain that is caused by an inflamed facet joint. A facet joint block is only done to relieve pain if the pain does not improve with other methods, such as medicine, exercise programs, and physical therapy. Tell a health care provider about:  Any allergies you have.  All medicines you are taking, including vitamins, herbs, eye drops, creams, and over-the-counter medicines.  Any problems you or family members have had with anesthetic medicines.  Any blood disorders you have.  Any surgeries you have had.  Any medical conditions you have or have had.  Whether you are pregnant or may be pregnant. What are the risks? Generally, this is a safe procedure. However, problems may occur, including:  Bleeding.  Injury to a nerve near the injection site.  Pain at the injection site.  Weakness or numbness in areas controlled by nerves near the injection site.  Infection.  Temporary fluid retention.  Allergic reactions to medicines or dyes.  Injury to other structures or organs near the injection site. What happens before the procedure? Medicines Ask your health care provider about:  Changing or stopping your regular medicines. This is especially important if you are taking diabetes medicines or blood thinners.  Taking medicines such as aspirin and ibuprofen. These medicines can thin your blood. Do not take these medicines unless your health care provider tells you to take them.  Taking over-the-counter medicines, vitamins, herbs, and supplements. Eating and  drinking Follow instructions from your health care provider about eating and drinking, which may include:  8 hours before the procedure - stop eating heavy meals or foods, such as meat, fried foods, or fatty foods.  6 hours before the procedure - stop eating light meals or foods, such as toast or cereal.  6 hours before the procedure - stop drinking milk or drinks that contain milk.  2 hours before the procedure - stop drinking clear liquids. Staying hydrated Follow instructions from your health care provider about hydration, which may include:  Up to 2 hours before the procedure - you may continue to drink clear liquids, such as water, clear fruit juice, black coffee, and plain tea. General instructions  Do not use any products that contain nicotine or tobacco for at least 4-6 weeks before the procedure. These products include cigarettes, e-cigarettes, and chewing tobacco. If you need help quitting, ask your health care provider.  Plan to have someone take you home from the hospital or clinic.  Ask your health care provider: ? How your surgery site will be marked. ? What steps will be taken to help prevent infection. These may include:  Removing hair at the surgery site.  Washing skin with a germ-killing soap.  Receiving antibiotic medicine. What happens during the procedure?   You will put on a hospital gown.  You will lie on your stomach on an X-ray table. You may be asked to lie in a different position if an injection will be made in your neck.  Machines will be used to monitor your oxygen levels, heart rate, and blood pressure.  Your skin will be cleaned.  If an injection will be made in your neck, an IV will be inserted into one of your veins. Fluids and medicine will flow directly into your body through the IV.  A numbing medicine (local anesthetic) will be applied to your skin. Your skin may sting or burn for a moment.  A video X-ray machine (fluoroscopy) will be  used to find the joint. In some cases, a CT scan may be used.  A contrast dye may be injected into the facet joint area to help find the joint.  When the joint is located, an anesthetic will be injected into the joint through the needle.  Your health care provider will ask you whether you feel pain relief. ? If you feel relief, a steroid may be injected to provide pain relief for a longer period of time. ? If you do not feel relief or feel only partial relief, additional injections of an anesthetic may be made in other facet joints.  The needle will be removed.  Your skin will be cleaned.  A bandage (dressing) will be applied over each injection site. The procedure may vary among health care providers and hospitals. What happens after the procedure?  Your blood pressure, heart rate, breathing rate, and blood oxygen level will be monitored until you leave the hospital or clinic.  You will lie down and rest for a period of time. Summary  A facet joint block is a procedure in which a numbing medicine (anesthetic) is injected into a facet joint. An anti-inflammatory medicine (stereoid) may also be injected.  Follow instructions from your health care provider about medicines and eating and drinking before the procedure.  Do not use any products that contain nicotine or tobacco for at least 4-6 weeks before the procedure.  You will  lie on your stomach for the procedure, but you may be asked to lie in a different position if an injection will be made in your neck.  When the joint is located, an anesthetic will be injected into the joint through the needle. This information is not intended to replace advice given to you by your health care provider. Make sure you discuss any questions you have with your health care provider. Document Revised: 05/27/2018 Document Reviewed: 01/08/2018 Elsevier Patient Education  [place].

## 2019-07-20 NOTE — Progress Notes (Signed)
PROVIDER NOTE: Information contained herein reflects review and annotations entered in association with encounter. Interpretation of such information and data should be left to medically-trained personnel. Information provided to patient can be located elsewhere in the medical record under "Patient Instructions". Document created using STT-dictation technology, any transcriptional errors that may result from process are unintentional.    Patient: Cynthia Mcpherson  Service Category: E/M  Provider: Gaspar Cola, MD  DOB: 24-Sep-1960  DOS: 07/20/2019  Referring Provider: Jodelle Green, FNP  MRN: [id number]  Setting: Ambulatory outpatient  PCP: Marval Regal, NP  Type: Established Patient  Specialty: Interventional Pain Management    Location: Office  Delivery: Face-to-face     HPI  Reason for encounter: [name], a 59 y.o. year old female, is here today for evaluation and management of her Chronic pain syndrome [G89.4]. [name] primary complain today is Back Pain Last encounter: Practice (07/07/2019). My last encounter with her was on 01/24/2019. Pertinent problems: [name] has Lumbar radiculopathy; Chronic low back pain (Primary Area of Pain) (Left) w/o sciatica; Chronic hip pain (Left); Lumbar facet syndrome (Left); Failed back surgical syndrome; Lumbar facet arthropathy; Spondylosis without myelopathy or radiculopathy, lumbosacral region; DDD (degenerative disc disease), lumbosacral; Chronic pain syndrome; and Chronic musculoskeletal pain on their pertinent problem list. Pain Assessment: Severity of Chronic pain is reported as a 5 /10. Location: Back Left/down the left leg and sometimes all the way to the foot.. Onset: More than a month ago. Quality: Burning, Sharp. Timing: Constant. Modifying factor(s): pain patch and tens unit and cold packs. Vitals:  height is 5' (1.524 m) and weight is 125 lb (56.7 kg). Her temporal temperature is 97.7 F (36.5 C). Her blood  pressure is 105/83 and her pulse is 104 (abnormal). Her respiration is 16 and oxygen saturation is 100%.   On 01/06/2019 the patient underwent her first diagnostic, left-sided, lumbar facet block under fluoroscopic guidance and IV sedation.  This provided the patient with complete relief of the pain until March 2021.  Even when the pain started returning, he has not gotten as bad as it was before we did the first diagnostic injection.  In any case, she returns today indicating that the pain has returned and she would like to have the procedure repeated.  Today we also spoke to the patient about other future alternatives such as radiofrequency ablation.  She indicated that she would need to think about it.  Provocative testing today was positive for lumbar facet syndrome on hyperextension and rotation.  ROS  Constitutional: Denies any fever or chills Gastrointestinal: No reported hemesis, hematochezia, vomiting, or acute GI distress Musculoskeletal: Denies any acute onset joint swelling, redness, loss of ROM, or weakness Neurological: No reported episodes of acute onset apraxia, aphasia, dysarthria, agnosia, amnesia, paralysis, loss of coordination, or loss of consciousness  Medication Review  acetaminophen, cyclobenzaprine, levothyroxine, and omeprazole  History Review  Allergy: [name] is allergic to influenza vaccines; iodinated diagnostic agents; latex; and shellfish allergy. Drug: [name]  reports no history of drug use. Alcohol:  reports previous alcohol use. Tobacco:  reports that she has never smoked. She has never used smokeless tobacco. Social: [name]  reports that she has never smoked. She has never used smokeless tobacco. She reports previous alcohol use. She reports that she does not use drugs. Medical:  has a past medical history of Thyroid disease. Surgical: [name]  has a past surgical history that includes Back surgery. Family: family history includes Hypertension  in  her brother; Multiple sclerosis in her sister; Psoriasis in her mother; Stroke in her sister.  Laboratory Chemistry Profile   Renal Lab Results  Component Value Date   BUN 14 06/23/2019   CREATININE 0.70 06/23/2019   GFR 85.27 06/23/2019     Hepatic Lab Results  Component Value Date   AST 36 06/23/2019   ALT 64 (H) 06/23/2019   ALBUMIN 4.5 06/23/2019   ALKPHOS 103 06/23/2019     Electrolytes Lab Results  Component Value Date   NA 137 06/23/2019   K 4.1 06/23/2019   CL 102 06/23/2019   CALCIUM 9.9 06/23/2019     Bone No results found for: VD25OH, VD125OH2TOT, HW2993ZJ6, RC7893YB0, 25OHVITD1, 25OHVITD2, 25OHVITD3, TESTOFREE, TESTOSTERONE   Inflammation (CRP: Acute Phase) (ESR: Chronic Phase) No results found for: CRP, ESRSEDRATE, LATICACIDVEN     Note: Above Lab results reviewed.  Recent Imaging Review  Fluoro (C-Arm) (<60 min) (No Report) Fluoro was used, but no Radiologist interpretation will be provided.  Please refer to "NOTES" tab for provider progress note. Note: Reviewed        Physical Exam  General appearance: Well nourished, well developed, and well hydrated. In no apparent acute distress Mental status: Alert, oriented x 3 (person, place, & time)       Respiratory: No evidence of acute respiratory distress Eyes: PERLA Vitals: BP 105/83 (BP Location: Right Arm, Patient Position: Sitting, Cuff Size: Normal)   Pulse (!) 104   Temp 97.7 F (36.5 C) (Temporal)   Resp 16   Ht 5' (1.524 m)   Wt 125 lb (56.7 kg)   SpO2 100%   BMI 24.41 kg/m  BMI: Estimated body mass index is 24.41 kg/m as calculated from the following:   Height as of this encounter: 5' (1.524 m).   Weight as of this encounter: 125 lb (56.7 kg). Ideal: Ideal body weight: 45.5 kg (100 lb 4.9 oz) Adjusted ideal body weight: 50 kg (110 lb 3 oz)  Assessment   Status Diagnosis  Controlled Recurring Worsening 1. Chronic pain syndrome   2. Chronic low back pain (Primary Area of Pain)  (Left) w/o sciatica   3. Lumbar facet syndrome (Left)      Updated Problems: Problem  Primary Hypothyroidism   Last Assessment & Plan:  Currently on Tirosint 100 mcg po daily.  Dose was reduced from 112 mcg in 9/19. Fatigue improved.   Plan: 1. Advised to take Tirosint 100 mcg  pill early in the morning on empty stomach with glass of water and to eat breakfast only after 30-28mns. Space out the calcium and minerals for 4 hours. PPI at bedtime 2. Advised to call with any symptoms of over or under replacement of thyroid hormone. 3. Recheck TSH and free T4 today 4. RTC in  6 months  Last Assessment & Plan:  Formatting of this note might be different from the original. Currently on Tirosint 100 mcg po daily.  Dose was reduced from 112 mcg in 9/19. Fatigue improved.   Plan: 1. Advised to take Tirosint 100 mcg  pill early in the morning on empty stomach with glass of water and to eat breakfast only after 30-658ms. Space out the calcium and minerals for 4 hours. PPI at bedtime 2. Advised to call with any symptoms of over or under replacement of thyroid hormone. 3. Recheck TSH and free T4 today 4. RTC in  6 months     PLAN OF CARE  Chronic Opioid Analgesic:  No opioid  analgesics prescribed by our practice.  Problem-specific:  No problem-specific Assessment & Plan notes found for this encounter.  [name] has a current medication list which includes the following long-term medication(s): cyclobenzaprine, levothyroxine, and omeprazole. Orders:  Orders Placed This Encounter  Procedures  . LUMBAR FACET(MEDIAL BRANCH NERVE BLOCK) MBNB    Standing Status:   Future    Standing Expiration Date:   08/19/2019    Scheduling Instructions:     Procedure: Lumbar facet block (AKA.: Lumbosacral medial branch nerve block)     Side: Left-sided     Level: L3-4, L4-5, & L5-S1 Facets (L2, L3, L4, L5, & S1 Medial Branch Nerves)     Sedation: With Sedation.     Timeframe: ASAA    Order  Specific Question:   Where will this procedure be performed?    Answer:   [facility]   Lab Orders  No laboratory test(s) ordered today   Imaging Orders  No imaging studies ordered today   Referral Orders  No referral(s) requested today   Pharmacotherapy (Medications Ordered): No orders of the defined types were placed in this encounter.  Medications administered today: Cynthia Mcpherson had no medications administered during this visit. Problem-specific:  No problem-specific Assessment & Plan notes found for this encounter.  [name] has a current medication list which includes the following long-term medication(s): cyclobenzaprine, levothyroxine, and omeprazole. Medications administered: Cynthia Mcpherson had no medications administered during this visit.   Follow-up plan:   Return for Procedure (w/ sedation): (L) L-FCT BLK #2.      Interventional management options: Procedure(s) under consideration:  Diagnostic left-sided lumbar facet block #2 under fluoroscopic guidance and IV sedation  Possible left-sided lumbar facet RFA      Recent Visits No visits were found meeting these conditions.  Showing recent visits within past 90 days and meeting all other requirements   Today's Visits Date Type Provider Dept  07/20/19 Office Visit Milinda Pointer, MD [facility]  Showing today's visits and meeting all other requirements   Future Appointments Date Type Provider Dept  08/04/19 Appointment Milinda Pointer, MD [facility]  Showing future appointments within next 90 days and meeting all other requirements    I discussed the assessment and treatment plan with the patient. The patient was provided an opportunity to ask questions and all were answered. The patient agreed with the plan and demonstrated an understanding of the instructions.  Patient advised to call back or seek an in-person evaluation if the symptoms or  condition worsens.  Duration of encounter: 30 minutes.  Note by: Gaspar Cola, MD Date: 07/20/2019; Time: 7:35 PM

## 2019-07-20 NOTE — Progress Notes (Signed)
Safety precautions to be maintained throughout the outpatient stay will include: orient to surroundings, keep bed in low position, maintain call bell within reach at all times, provide assistance with transfer out of bed and ambulation.  

## 2019-07-21 ENCOUNTER — Telehealth: Payer: Self-pay | Admitting: Nurse Practitioner

## 2019-07-21 DIAGNOSIS — D75839 Thrombocytosis, unspecified: Secondary | ICD-10-CM

## 2019-07-21 NOTE — Telephone Encounter (Signed)
Spoke with patient about her labs; wanted them printed so she could pick them up. Labs are printed and up front for pickup. Also patient was supposed to have a hematology referral made and no referral was placed yet. Can you put referral in for patient?

## 2019-07-21 NOTE — Telephone Encounter (Signed)
Patient aware.

## 2019-07-21 NOTE — Telephone Encounter (Signed)
Let her now that I placed the referral.

## 2019-07-21 NOTE — Addendum Note (Signed)
Addended by: Denice Paradise A on: 07/21/2019 04:05 PM   Modules accepted: Orders

## 2019-07-21 NOTE — Telephone Encounter (Signed)
Pt has questions about platelets/labs-please advise [phone]

## 2019-07-21 NOTE — Telephone Encounter (Signed)
LMTCB

## 2019-07-22 NOTE — Addendum Note (Signed)
Addended by: Denice Paradise A on: 07/22/2019 02:06 PM   Modules accepted: Orders

## 2019-07-25 ENCOUNTER — Telehealth: Payer: Self-pay | Admitting: Pain Medicine

## 2019-07-25 NOTE — Telephone Encounter (Signed)
Patient called stating her platelets are very high and she wants to know if it is still ok for her to have procedure on 6-17. Please check with Dr. Dossie Arbour and let patient

## 2019-07-25 NOTE — Telephone Encounter (Signed)
Ok to proceed with facet block per Dr. Dossie Arbour. Patient notified.

## 2019-07-25 NOTE — Telephone Encounter (Signed)
Message sent to Dr Naveira 

## 2019-07-26 ENCOUNTER — Inpatient Hospital Stay: Payer: Medicare Other | Attending: Oncology | Admitting: Oncology

## 2019-07-26 ENCOUNTER — Other Ambulatory Visit: Payer: Self-pay

## 2019-07-26 ENCOUNTER — Encounter: Payer: Self-pay | Admitting: Oncology

## 2019-07-26 ENCOUNTER — Inpatient Hospital Stay: Payer: Medicare Other

## 2019-07-26 VITALS — BP 95/78 | HR 92 | Temp 98.3°F | Resp 16 | Wt 127.5 lb

## 2019-07-26 DIAGNOSIS — D75839 Thrombocytosis, unspecified: Secondary | ICD-10-CM

## 2019-07-26 DIAGNOSIS — R7989 Other specified abnormal findings of blood chemistry: Secondary | ICD-10-CM | POA: Diagnosis present

## 2019-07-26 DIAGNOSIS — E039 Hypothyroidism, unspecified: Secondary | ICD-10-CM | POA: Diagnosis not present

## 2019-07-26 DIAGNOSIS — E785 Hyperlipidemia, unspecified: Secondary | ICD-10-CM | POA: Diagnosis not present

## 2019-07-26 DIAGNOSIS — Z79899 Other long term (current) drug therapy: Secondary | ICD-10-CM | POA: Insufficient documentation

## 2019-07-26 DIAGNOSIS — K219 Gastro-esophageal reflux disease without esophagitis: Secondary | ICD-10-CM | POA: Insufficient documentation

## 2019-07-26 DIAGNOSIS — Z8249 Family history of ischemic heart disease and other diseases of the circulatory system: Secondary | ICD-10-CM | POA: Diagnosis not present

## 2019-07-26 LAB — CBC WITH DIFFERENTIAL/PLATELET
Abs Immature Granulocytes: 0.02 10*3/uL (ref 0.00–0.07)
Basophils Absolute: 0 10*3/uL (ref 0.0–0.1)
Basophils Relative: 0 %
Eosinophils Absolute: 0.1 10*3/uL (ref 0.0–0.5)
Eosinophils Relative: 1 %
HCT: 41.8 % (ref 36.0–46.0)
Hemoglobin: 13.7 g/dL (ref 12.0–15.0)
Immature Granulocytes: 0 %
Lymphocytes Relative: 27 %
Lymphs Abs: 2 10*3/uL (ref 0.7–4.0)
MCH: 27.3 pg (ref 26.0–34.0)
MCHC: 32.8 g/dL (ref 30.0–36.0)
MCV: 83.3 fL (ref 80.0–100.0)
Monocytes Absolute: 0.5 10*3/uL (ref 0.1–1.0)
Monocytes Relative: 7 %
Neutro Abs: 4.7 10*3/uL (ref 1.7–7.7)
Neutrophils Relative %: 65 %
Platelets: 432 10*3/uL — ABNORMAL HIGH (ref 150–400)
RBC: 5.02 MIL/uL (ref 3.87–5.11)
RDW: 15 % (ref 11.5–15.5)
WBC: 7.3 10*3/uL (ref 4.0–10.5)
nRBC: 0 % (ref 0.0–0.2)

## 2019-07-26 LAB — COMPREHENSIVE METABOLIC PANEL
ALT: 72 U/L — ABNORMAL HIGH (ref 0–44)
AST: 48 U/L — ABNORMAL HIGH (ref 15–41)
Albumin: 4.5 g/dL (ref 3.5–5.0)
Alkaline Phosphatase: 84 U/L (ref 38–126)
Anion gap: 9 (ref 5–15)
BUN: 15 mg/dL (ref 6–20)
CO2: 29 mmol/L (ref 22–32)
Calcium: 9.5 mg/dL (ref 8.9–10.3)
Chloride: 103 mmol/L (ref 98–111)
Creatinine, Ser: 0.63 mg/dL (ref 0.44–1.00)
GFR calc Af Amer: >60 mL/min (ref >60)
GFR calc non Af Amer: >60 mL/min (ref >60)
Glucose, Bld: 119 mg/dL — ABNORMAL HIGH (ref 70–99)
Potassium: 4.3 mmol/L (ref 3.5–5.1)
Sodium: 141 mmol/L (ref 135–145)
Total Bilirubin: 0.6 mg/dL (ref 0.3–1.2)
Total Protein: 7.6 g/dL (ref 6.5–8.1)

## 2019-07-26 LAB — TECHNOLOGIST SMEAR REVIEW: Plt Morphology: ADEQUATE

## 2019-07-26 LAB — IRON AND TIBC
Iron: 81 ug/dL (ref 28–170)
Saturation Ratios: 19 % (ref 10.4–31.8)
TIBC: 419 ug/dL (ref 250–450)
UIBC: 338 ug/dL

## 2019-07-26 LAB — SEDIMENTATION RATE: Sed Rate: 6 mm/hr (ref 0–30)

## 2019-07-26 LAB — FERRITIN: Ferritin: 23 ng/mL (ref 11–307)

## 2019-07-26 NOTE — Progress Notes (Signed)
Patient here for initial oncology appointment, expresses complaints of back pain at times and fatigue.

## 2019-07-29 NOTE — Progress Notes (Signed)
Hematology/Oncology Consult note [facility] Telephone:([phone] [phone]  Patient Care Team: Marval Regal, NP as PCP - General (Nurse Practitioner)   Name of the patient: Glinda Natzke  [id number]  March 10, 1960    Reason for referral-thrombocytosis   Referring physician-Kimberly Jerelene Redden, NP  Date of visit: 07/29/19   History of presenting illness- Patient is a 59 year old female with a past medical history significant for hypothyroidism.  She has been referred to [place] for thrombocytosis.  Most recent labs from 06/23/2019 showed white count of 8, H&H of 13.3/40 with an MCV of 83 and a platelet count of 508.  Prior to that her platelet count was 474 in September 2020.  Patient currently feels well and has occasional back pain and fatigue but denies other complaints.  Denies any prior history of thromboembolic events heart attacks or TIAs.  She is currently not taking any blood thinner.  ECOG PS- 1  Pain scale- 0   Review of systems- Review of Systems  Constitutional: Positive for malaise/fatigue. Negative for chills, fever and weight loss.  HENT: Negative for congestion, ear discharge and nosebleeds.   Eyes: Negative for blurred vision.  Respiratory: Negative for cough, hemoptysis, sputum production, shortness of breath and wheezing.   Cardiovascular: Negative for chest pain, palpitations, orthopnea and claudication.  Gastrointestinal: Negative for abdominal pain, blood in stool, constipation, diarrhea, heartburn, melena, nausea and vomiting.  Genitourinary: Negative for dysuria, flank pain, frequency, hematuria and urgency.  Musculoskeletal: Positive for back pain. Negative for joint pain and myalgias.  Skin: Negative for rash.  Neurological: Negative for dizziness, tingling, focal weakness, seizures, weakness and headaches.  Endo/Heme/Allergies: Does not bruise/bleed easily.  Psychiatric/Behavioral: Negative for depression and suicidal  ideas. The patient does not have insomnia.     Allergies  Allergen Reactions  . Influenza Vaccines Hives and Rash  . Iodinated Diagnostic Agents     Rash and breathing problems  . Latex Hives and Rash  . Shellfish Allergy Hives and Rash    Patient Active Problem List   Diagnosis Date Noted  . Thrombocytosis ([place]) 07/21/2019  . Encounter for screening and preventative care 06/24/2019  . Hyperlipidemia 06/24/2019  . Chronic pain syndrome 01/24/2019  . Chronic musculoskeletal pain 01/24/2019  . Spondylosis without myelopathy or radiculopathy, lumbosacral region 01/06/2019  . DDD (degenerative disc disease), lumbosacral 01/06/2019  . Allergy to povidone-iodine topical antiseptic 01/06/2019  . History of allergy to radiographic contrast media 01/06/2019  . History of allergy to shellfish 01/06/2019  . Latex precautions, history of latex allergy 01/06/2019  . Chronic low back pain (Primary Area of Pain) (Left) w/o sciatica 12/29/2018  . Chronic hip pain (Left) 12/29/2018  . Lumbar facet syndrome (Left) 12/29/2018  . Failed back surgical syndrome 12/29/2018  . Lumbar facet arthropathy 12/29/2018  . Acquired hypothyroidism 11/12/2018  . Lumbar radiculopathy 11/12/2018  . Primary hypothyroidism 09/04/2017     Past Medical History:  Diagnosis Date  . GERD (gastroesophageal reflux disease)   . Thyroid disease    hypothyroidism      Past Surgical History:  Procedure Laterality Date  . BACK SURGERY      Social History   Socioeconomic History  . Marital status: Married    Spouse name: Not on file  . Number of children: Not on file  . Years of education: Not on file  . Highest education level: Not on file  Occupational History  . Not on file  Tobacco Use  . Smoking status: Never Smoker  .  Smokeless tobacco: Never Used  Substance and Sexual Activity  . Alcohol use: Not Currently  . Drug use: Never  . Sexual activity: Not on file  Other Topics Concern  . Not on file    Social History Narrative   Married lives with her husband, has 1 grown daughter.  Retired Marine scientist.  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    Social Determinants of Health   Financial Resource Strain:   . Difficulty of Paying Living Expenses:   Food Insecurity:   . Worried About Charity fundraiser in the Last Year:   . Arboriculturist in the Last Year:   Transportation Needs:   . Film/video editor (Medical):   Marland Kitchen Lack of Transportation (Non-Medical):   Physical Activity:   . Days of Exercise per Week:   . Minutes of Exercise per Session:   Stress:   . Feeling of Stress :   Social Connections:   . Frequency of Communication with Friends and Family:   . Frequency of Social Gatherings with Friends and Family:   . Attends Religious Services:   . Active Member of Clubs or Organizations:   . Attends Archivist Meetings:   Marland Kitchen Marital Status:   Intimate Partner Violence:   . Fear of Current or Ex-Partner:   . Emotionally Abused:   Marland Kitchen Physically Abused:   . Sexually Abused:      Family History  Problem Relation Age of Onset  . Psoriasis Mother   . Multiple sclerosis Sister   . Stroke Sister   . Hypertension Brother      Current Outpatient Medications:  .  acetaminophen (TYLENOL) 500 MG tablet, Take 500 mg by mouth every 6 (six) hours as needed., Disp: , Rfl:  .  cyclobenzaprine (FLEXERIL) 10 MG tablet, Take 1 tablet (10 mg total) by mouth 3 (three) times daily as needed for muscle spasms., Disp: 90 tablet, Rfl: 5 .  levothyroxine (SYNTHROID) 88 MCG tablet, Take 88 mcg by mouth daily., Disp: , Rfl:  .  Multiple Vitamins-Minerals (MULTIVITAMIN WITH MINERALS) tablet, Take 1 tablet by mouth daily., Disp: , Rfl:  .  omeprazole (PRILOSEC) 20 MG capsule, Take by mouth., Disp: , Rfl:    Physical exam:  Vitals:   07/26/19 1342  BP: 95/78  Pulse: 92  Resp: 16  Temp: 98.3 F (36.8 C)  TempSrc: Tympanic  SpO2: 100%  Weight: 127 lb 8 oz (57.8 kg)   Physical  Exam Cardiovascular:     Rate and Rhythm: Normal rate and regular rhythm.     Heart sounds: Normal heart sounds.  Pulmonary:     Effort: Pulmonary effort is normal.     Breath sounds: Normal breath sounds.  Abdominal:     General: Bowel sounds are normal.     Palpations: Abdomen is soft.     Comments: No palpable hepatosplenomegaly  Skin:    General: Skin is warm and dry.  Neurological:     Mental Status: She is alert and oriented to person, place, and time.        CMP Latest Ref Rng & Units 07/26/2019  Glucose 70 - 99 mg/dL 119(H)  BUN 6 - 20 mg/dL 15  Creatinine 0.44 - 1.00 mg/dL 0.63  Sodium 135 - 145 mmol/L 141  Potassium 3.5 - 5.1 mmol/L 4.3  Chloride 98 - 111 mmol/L 103  CO2 22 - 32 mmol/L 29  Calcium 8.9 - 10.3 mg/dL 9.5  Total Protein 6.5 - 8.1 g/dL  7.6  Total Bilirubin 0.3 - 1.2 mg/dL 0.6  Alkaline Phos 38 - 126 U/L 84  AST 15 - 41 U/L 48(H)  ALT 0 - 44 U/L 72(H)   CBC Latest Ref Rng & Units 07/26/2019  WBC 4.0 - 10.5 K/uL 7.3  Hemoglobin 12.0 - 15.0 g/dL 13.7  Hematocrit 36 - 46 % 41.8  Platelets 150 - 400 K/uL 432(H)     Assessment and plan- Patient is a 59 y.o. female referred for thrombocytosis  I have discussed different etiologies for thrombocytosis and differences between reactive versus primary thrombocytosis.  For reactive causes I will check a CBC with differential, ferritin and iron studies as well as ESR.  To rule out a primary myeloproliferative process I will check a JAK2 mutation with reflex to CAL R and MPL. We will also check smear review today.  Return to clinic in 2 weeks to discuss results of blood work and further management   Thank you for this kind referral and the opportunity to participate in the care of this patient   Visit Diagnosis 1. Thrombocytosis ([place])     Dr. Randa Evens, MD, MPH [facility] [phone] 07/29/2019  12:20 PM

## 2019-08-04 ENCOUNTER — Encounter: Payer: Self-pay | Admitting: Pain Medicine

## 2019-08-04 ENCOUNTER — Ambulatory Visit (HOSPITAL_BASED_OUTPATIENT_CLINIC_OR_DEPARTMENT_OTHER): Payer: Medicare Other | Admitting: Pain Medicine

## 2019-08-04 ENCOUNTER — Other Ambulatory Visit: Payer: Self-pay

## 2019-08-04 ENCOUNTER — Ambulatory Visit
Admission: RE | Admit: 2019-08-04 | Discharge: 2019-08-04 | Disposition: A | Discharge status: Home / Self Care | Payer: Medicare Other | Source: Ambulatory Visit | Attending: Pain Medicine | Admitting: Pain Medicine

## 2019-08-04 VITALS — BP 103/55 | HR 100 | Temp 98.1°F | Resp 19 | Ht 60.0 in | Wt 124.0 lb

## 2019-08-04 DIAGNOSIS — M47817 Spondylosis without myelopathy or radiculopathy, lumbosacral region: Secondary | ICD-10-CM | POA: Diagnosis present

## 2019-08-04 DIAGNOSIS — G8929 Other chronic pain: Secondary | ICD-10-CM

## 2019-08-04 DIAGNOSIS — M47816 Spondylosis without myelopathy or radiculopathy, lumbar region: Secondary | ICD-10-CM | POA: Insufficient documentation

## 2019-08-04 DIAGNOSIS — Z91041 Radiographic dye allergy status: Secondary | ICD-10-CM | POA: Diagnosis present

## 2019-08-04 DIAGNOSIS — Z91040 Latex allergy status: Secondary | ICD-10-CM

## 2019-08-04 DIAGNOSIS — Z883 Allergy status to other anti-infective agents status: Secondary | ICD-10-CM | POA: Diagnosis present

## 2019-08-04 DIAGNOSIS — M5137 Other intervertebral disc degeneration, lumbosacral region: Secondary | ICD-10-CM

## 2019-08-04 DIAGNOSIS — M961 Postlaminectomy syndrome, not elsewhere classified: Secondary | ICD-10-CM | POA: Diagnosis present

## 2019-08-04 DIAGNOSIS — Z91013 Allergy to seafood: Secondary | ICD-10-CM

## 2019-08-04 DIAGNOSIS — M545 Low back pain: Secondary | ICD-10-CM | POA: Diagnosis present

## 2019-08-04 LAB — JAK2 W/REFLEX TO CALR/MPL

## 2019-08-04 LAB — CALR + MPL (REFLEXED)

## 2019-08-04 MED ORDER — MIDAZOLAM HCL 5 MG/5ML IJ SOLN
1.0000 mg | INTRAMUSCULAR | Status: DC | PRN
  Administered 2019-08-04 (×2): 1 mg via INTRAVENOUS
  Filled 2019-08-04: qty 5

## 2019-08-04 MED ORDER — ROPIVACAINE HCL 2 MG/ML IJ SOLN
9.0000 mL | Freq: Once | INTRAMUSCULAR | Status: AC
  Administered 2019-08-04: 9 mL via PERINEURAL
  Filled 2019-08-04: qty 10

## 2019-08-04 MED ORDER — LACTATED RINGERS IV SOLN
1000.0000 mL | Freq: Once | INTRAVENOUS | Status: AC
  Administered 2019-08-04: 1000 mL via INTRAVENOUS

## 2019-08-04 MED ORDER — FENTANYL CITRATE (PF) 100 MCG/2ML IJ SOLN
25.0000 ug | INTRAMUSCULAR | Status: DC | PRN
  Administered 2019-08-04: 50 ug via INTRAVENOUS
  Filled 2019-08-04: qty 2

## 2019-08-04 MED ORDER — LIDOCAINE HCL 2 % IJ SOLN
20.0000 mL | Freq: Once | INTRAMUSCULAR | Status: AC
  Administered 2019-08-04: 400 mg
  Filled 2019-08-04: qty 20

## 2019-08-04 MED ORDER — TRIAMCINOLONE ACETONIDE 40 MG/ML IJ SUSP
40.0000 mg | Freq: Once | INTRAMUSCULAR | Status: AC
  Administered 2019-08-04: 40 mg
  Filled 2019-08-04: qty 1

## 2019-08-04 NOTE — Progress Notes (Signed)
PROVIDER NOTE: Information contained herein reflects review and annotations entered in association with encounter. Interpretation of such information and data should be left to medically-trained personnel. Information provided to patient can be located elsewhere in the medical record under "Patient Instructions". Document created using STT-dictation technology, any transcriptional errors that may result from process are unintentional.    Patient: Cynthia Mcpherson  Service Category: Procedure  Provider: Gaspar Cola, MD  DOB: 05/24/1960  DOS: 08/04/2019  Location: [facility]  MRN: [id number]  Setting: Ambulatory - outpatient  Referring Provider: Marval Regal, NP  Type: Established Patient  Specialty: Interventional Pain Management  PCP: Marval Regal, NP   Primary Reason for Visit: Interventional Pain Management Treatment. CC: Back Pain (lower)  Procedure:          Anesthesia, Analgesia, Anxiolysis:  Type: Lumbar Facet, Medial Branch Block(s) #2  Primary Purpose: Diagnostic Region: Posterolateral Lumbosacral Spine Level: L2, L3, L4, L5, & S1 Medial Branch Level(s). Injecting these levels blocks the L3-4, L4-5, and L5-S1 lumbar facet joints. Laterality: Left  Type: Moderate (Conscious) Sedation combined with Local Anesthesia Indication(s): Analgesia and Anxiety Route: Intravenous (IV) IV Access: Secured Sedation: Meaningful verbal contact was maintained at all times during the procedure  Local Anesthetic: Lidocaine 1-2%  Position: Prone   Indications: 1. Lumbar facet syndrome (Left)   2. Spondylosis without myelopathy or radiculopathy, lumbosacral region   3. Lumbar facet arthropathy   4. DDD (degenerative disc disease), lumbosacral   5. Chronic low back pain (Primary Area of Pain) (Left) w/o sciatica   6. Failed back surgical syndrome    Allergy to povidone-iodine topical antiseptic    History of allergy to radiographic contrast media    History of  allergy to shellfish    Latex precautions, history of latex allergy    Pain Score: Pre-procedure: 2 /10 Post-procedure: 0-No pain/10   Pre-op Assessment:  [name] is a 59 y.o. (year old), female patient, seen today for interventional treatment. She  has a past surgical history that includes Back surgery. [name] has a current medication list which includes the following prescription(s): acetaminophen, levothyroxine, multivitamin with minerals, omeprazole, and cyclobenzaprine, and the following Facility-Administered Medications: fentanyl and midazolam. Her primarily concern today is the Back Pain (lower)  Initial Vital Signs:  Pulse/HCG Rate: 100ECG Heart Rate: 71 Temp: 98.2 F (36.8 C) Resp: 18 BP: 125/86 SpO2: 100 %  BMI: Estimated body mass index is 24.22 kg/m as calculated from the following:   Height as of this encounter: 5' (1.524 m).   Weight as of this encounter: 124 lb (56.2 kg).  Risk Assessment: Allergies: Reviewed. She is allergic to influenza vaccines, iodinated diagnostic agents, latex, and shellfish allergy.  Allergy Precautions: None required Coagulopathies: Reviewed. None identified.  Blood-thinner therapy: None at this time Active Infection(s): Reviewed. None identified. [name] is afebrile  Site Confirmation: [name] was asked to confirm the procedure and laterality before marking the site Procedure checklist: Completed Consent: Before the procedure and under the influence of no sedative(s), amnesic(s), or anxiolytics, the patient was informed of the treatment options, risks and possible complications. To fulfill our ethical and legal obligations, as recommended by the American Medical Association's Code of Ethics, I have informed the patient of my clinical impression; the nature and purpose of the treatment or procedure; the risks, benefits, and possible complications of the intervention; the alternatives, including doing nothing; the risk(s) and  benefit(s) of the alternative treatment(s) or procedure(s); and the risk(s) and  benefit(s) of doing nothing. The patient was provided information about the general risks and possible complications associated with the procedure. These may include, but are not limited to: failure to achieve desired goals, infection, bleeding, organ or nerve damage, allergic reactions, paralysis, and death. In addition, the patient was informed of those risks and complications associated to Spine-related procedures, such as failure to decrease pain; infection (i.e.: Meningitis, epidural or intraspinal abscess); bleeding (i.e.: epidural hematoma, subarachnoid hemorrhage, or any other type of intraspinal or peri-dural bleeding); organ or nerve damage (i.e.: Any type of peripheral nerve, nerve root, or spinal cord injury) with subsequent damage to sensory, motor, and/or autonomic systems, resulting in permanent pain, numbness, and/or weakness of one or several areas of the body; allergic reactions; (i.e.: anaphylactic reaction); and/or death. Furthermore, the patient was informed of those risks and complications associated with the medications. These include, but are not limited to: allergic reactions (i.e.: anaphylactic or anaphylactoid reaction(s)); adrenal axis suppression; blood sugar elevation that in diabetics may result in ketoacidosis or comma; water retention that in patients with history of congestive heart failure may result in shortness of breath, pulmonary edema, and decompensation with resultant heart failure; weight gain; swelling or edema; medication-induced neural toxicity; particulate matter embolism and blood vessel occlusion with resultant organ, and/or nervous system infarction; and/or aseptic necrosis of one or more joints. Finally, the patient was informed that Medicine is not an exact science; therefore, there is also the possibility of unforeseen or unpredictable risks and/or possible complications that may  result in a catastrophic outcome. The patient indicated having understood very clearly. We have given the patient no guarantees and we have made no promises. Enough time was given to the patient to ask questions, all of which were answered to the patient's satisfaction. [name] has indicated that she wanted to continue with the procedure. Attestation: I, the ordering provider, attest that I have discussed with the patient the benefits, risks, side-effects, alternatives, likelihood of achieving goals, and potential problems during recovery for the procedure that I have provided informed consent. Date  Time: 08/04/2019  9:30 AM  Pre-Procedure Preparation:  Monitoring: As per clinic protocol. Respiration, ETCO2, SpO2, BP, heart rate and rhythm monitor placed and checked for adequate function Safety Precautions: Patient was assessed for positional comfort and pressure points before starting the procedure. Time-out: I initiated and conducted the "Time-out" before starting the procedure, as per protocol. The patient was asked to participate by confirming the accuracy of the "Time Out" information. Verification of the correct person, site, and procedure were performed and confirmed by me, the nursing staff, and the patient. "Time-out" conducted as per Joint Commission's Universal Protocol (UP.01.01.01). Time: 1696  Description of Procedure:          Laterality: Left Levels:  L2, L3, L4, L5, & S1 Medial Branch Level(s) Area Prepped: Posterior Lumbosacral Region Hibiclens (4.0% Chlorhexidine gluconate solution) Safety Precautions: Aspiration looking for blood return was conducted prior to all injections. At no point did we inject any substances, as a needle was being advanced. Before injecting, the patient was told to immediately notify me if she was experiencing any new onset of "ringing in the ears, or metallic taste in the mouth". No attempts were made at seeking any paresthesias. Safe injection  practices and needle disposal techniques used. Medications properly checked for expiration dates. SDV (single dose vial) medications used. After the completion of the procedure, all disposable equipment used was discarded in the proper designated medical waste containers. Local Anesthesia:  Protocol guidelines were followed. The patient was positioned over the fluoroscopy table. The area was prepped in the usual manner. The time-out was completed. The target area was identified using fluoroscopy. A 12-in long, straight, sterile hemostat was used with fluoroscopic guidance to locate the targets for each level blocked. Once located, the skin was marked with an approved surgical skin marker. Once all sites were marked, the skin (epidermis, dermis, and hypodermis), as well as deeper tissues (fat, connective tissue and muscle) were infiltrated with a small amount of a short-acting local anesthetic, loaded on a 10cc syringe with a 25G, 1.5-in  Needle. An appropriate amount of time was allowed for local anesthetics to take effect before proceeding to the next step. Local Anesthetic: Lidocaine 2.0% The unused portion of the local anesthetic was discarded in the proper designated containers. Technical explanation of process:  L2 Medial Branch Nerve Block (MBB): The target area for the L2 medial branch is at the junction of the postero-lateral aspect of the superior articular process and the superior, posterior, and medial edge of the transverse process of L3. Under fluoroscopic guidance, a Quincke needle was inserted until contact was made with os over the superior postero-lateral aspect of the pedicular shadow (target area). After negative aspiration for blood, 0.5 mL of the nerve block solution was injected without difficulty or complication. The needle was removed intact. L3 Medial Branch Nerve Block (MBB): The target area for the L3 medial branch is at the junction of the postero-lateral aspect of the superior  articular process and the superior, posterior, and medial edge of the transverse process of L4. Under fluoroscopic guidance, a Quincke needle was inserted until contact was made with os over the superior postero-lateral aspect of the pedicular shadow (target area). After negative aspiration for blood, 0.5 mL of the nerve block solution was injected without difficulty or complication. The needle was removed intact. L4 Medial Branch Nerve Block (MBB): The target area for the L4 medial branch is at the junction of the postero-lateral aspect of the superior articular process and the superior, posterior, and medial edge of the transverse process of L5. Under fluoroscopic guidance, a Quincke needle was inserted until contact was made with os over the superior postero-lateral aspect of the pedicular shadow (target area). After negative aspiration for blood, 0.5 mL of the nerve block solution was injected without difficulty or complication. The needle was removed intact. L5 Medial Branch Nerve Block (MBB): The target area for the L5 medial branch is at the junction of the postero-lateral aspect of the superior articular process and the superior, posterior, and medial edge of the sacral ala. Under fluoroscopic guidance, a Quincke needle was inserted until contact was made with os over the superior postero-lateral aspect of the pedicular shadow (target area). After negative aspiration for blood, 0.5 mL of the nerve block solution was injected without difficulty or complication. The needle was removed intact. S1 Medial Branch Nerve Block (MBB): The target area for the S1 medial branch is at the posterior and inferior 6 o'clock position of the L5-S1 facet joint. Under fluoroscopic guidance, the Quincke needle inserted for the L5 MBB was redirected until contact was made with os over the inferior and postero aspect of the sacrum, at the 6 o' clock position under the L5-S1 facet joint (Target area). After negative aspiration  for blood, 0.5 mL of the nerve block solution was injected without difficulty or complication. The needle was removed intact.  Nerve block solution: 0.2% PF-Ropivacaine + Triamcinolone (40  mg/mL) diluted to a final concentration of 4 mg of Triamcinolone/mL of Ropivacaine The unused portion of the solution was discarded in the proper designated containers. Procedural Needles: 22-gauge, 3.5-inch, Quincke needles used for all levels.  Once the entire procedure was completed, the treated area was cleaned, making sure to leave some of the prepping solution back to take advantage of its long term bactericidal properties.   Illustration of the posterior view of the lumbar spine and the posterior neural structures. Laminae of L2 through S1 are labeled. DPRL5, dorsal primary ramus of L5; DPRS1, dorsal primary ramus of S1; DPR3, dorsal primary ramus of L3; FJ, facet (zygapophyseal) joint L3-L4; I, inferior articular process of L4; LB1, lateral branch of dorsal primary ramus of L1; IAB, inferior articular branches from L3 medial branch (supplies L4-L5 facet joint); IBP, intermediate branch plexus; MB3, medial branch of dorsal primary ramus of L3; NR3, third lumbar nerve root; S, superior articular process of L5; SAB, superior articular branches from L4 (supplies L4-5 facet joint also); TP3, transverse process of L3.  Vitals:   08/04/19 1053 08/04/19 1103 08/04/19 1113 08/04/19 1123  BP: 108/85 (!) 100/51 (!) 101/54 (!) 103/55  Pulse: 100     Resp: 15 17 19 19   Temp:  98.2 F (36.8 C)  98.1 F (36.7 C)  SpO2: 100% 100% 100% 100%  Weight:      Height:         Start Time: 1049 hrs. End Time: 1053 hrs.  Imaging Guidance (Spinal):          Type of Imaging Technique: Fluoroscopy Guidance (Spinal) Indication(s): Assistance in needle guidance and placement for procedures requiring needle placement in or near specific anatomical locations not easily accessible without such assistance. Exposure Time: Please  see nurses notes. Contrast: None used. Fluoroscopic Guidance: I was personally present during the use of fluoroscopy. "Tunnel Vision Technique" used to obtain the best possible view of the target area. Parallax error corrected before commencing the procedure. "Direction-depth-direction" technique used to introduce the needle under continuous pulsed fluoroscopy. Once target was reached, antero-posterior, oblique, and lateral fluoroscopic projection used confirm needle placement in all planes. Images permanently stored in EMR. Interpretation: No contrast injected. I personally interpreted the imaging intraoperatively. Adequate needle placement confirmed in multiple planes. Permanent images saved into the patient's record.  Antibiotic Prophylaxis:   Anti-infectives (From admission, onward)   None     Indication(s): None identified  Post-operative Assessment:  Post-procedure Vital Signs:  Pulse/HCG Rate: 10071 Temp: 98.1 F (36.7 C) Resp: 19 BP: (!) 103/55 SpO2: 100 %  EBL: None  Complications: No immediate post-treatment complications observed by team, or reported by patient.  Note: The patient tolerated the entire procedure well. A repeat set of vitals were taken after the procedure and the patient was kept under observation following institutional policy, for this type of procedure. Post-procedural neurological assessment was performed, showing return to baseline, prior to discharge. The patient was provided with post-procedure discharge instructions, including a section on how to identify potential problems. Should any problems arise concerning this procedure, the patient was given instructions to immediately contact us, at any time, without hesitation. In any case, we plan to contact the patient by telephone for a follow-up status report regarding this interventional procedure.  Comments:  No additional relevant information.  Plan of Care  Orders:  Orders Placed This Encounter    Procedures  . LUMBAR FACET(MEDIAL BRANCH NERVE BLOCK) MBNB    Scheduling Instructions:  Procedure: Lumbar facet block (AKA.: Lumbosacral medial branch nerve block)     Side: Left-sided     Level: L3-4, L4-5, & L5-S1 Facets (L2, L3, L4, L5, & S1 Medial Branch Nerves)     Sedation: With Sedation.     Timeframe: Today    Order Specific Question:   Where will this procedure be performed?    Answer:   [facility]  . DG PA[facility] C-ARM 1-60 MIN NO REPORT    Intraoperative interpretation by procedural physician at [facility].    Standing Status:   Standing    Number of Occurrences:   1    Order Specific Question:   Reason for exam:    Answer:   Assistance in needle guidance and placement for procedures requiring needle placement in or near specific anatomical locations not easily accessible without such assistance.  . Informed Consent Details: Physician/Practitioner Attestation; Transcribe to consent form and obtain patient signature    Nursing Order: Transcribe to consent form and obtain patient signature. Note: Always confirm laterality of pain with Ms. Torres, before procedure. Procedure: Lumbar Facet Block  under fluoroscopic guidance Indication/Reason: Low Back Pain, with our without leg pain, due to Facet Joint Arthralgia (Joint Pain) known as Lumbar Facet Syndrome, secondary to Lumbar, and/or Lumbosacral Spondylosis (Arthritis of the Spine), without myelopathy or radiculopathy (Nerve Damage). Provider Attestation: I, [place] Dossie Arbour, MD, (Pain Management Specialist), the physician/practitioner, attest that I have discussed with the patient the benefits, risks, side effects, alternatives, likelihood of achieving goals and potential problems during recovery for the procedure that I have provided informed consent.  . Provide equipment / supplies at bedside    Equipment required: Single use, disposable, "Block Tray"    Standing Status:   Standing    Number of  Occurrences:   1    Order Specific Question:   Specify    Answer:   Block Tray  . Miscellanous precautions    Standing Status:   Standing    Number of Occurrences:   1  . Miscellanous precautions    NOTE: Although It is true that patients can have allergies to shellfish and that shellfish contain iodine, most shellfish  allergies are due to two protein allergens present in the shellfish: tropomyosins and parvalbumin. Not all patients with shellfish allergies are allergic to iodine. However, as a precaution, avoid using iodine containing products.    Standing Status:   Standing    Number of Occurrences:   1  . Latex precautions    Activate Latex-Free Protocol.    Standing Status:   Standing    Number of Occurrences:   1  . Skin care precautions    Patient indicates being allergic to a specific skin prepping solution. Please avoid.    Standing Status:   Standing    Number of Occurrences:   1   Chronic Opioid Analgesic:  No opioid analgesics prescribed by our practice.   Medications ordered for procedure: Meds ordered this encounter  Medications  . lidocaine (XYLOCAINE) 2 % (with pres) injection 400 mg  . lactated ringers infusion 1,000 mL  . midazolam (VERSED) 5 MG/5ML injection 1-2 mg    Make sure Flumazenil is available in the pyxis when using this medication. If oversedation occurs, administer 0.2 mg IV over 15 sec. If after 45 sec no response, administer 0.2 mg again over 1 min; may repeat at 1 min intervals; not to exceed 4 doses (1 mg)  . fentaNYL (SUBLIMAZE)  injection 25-50 mcg    Make sure Narcan is available in the pyxis when using this medication. In the event of respiratory depression (RR< 8/min): Titrate NARCAN (naloxone) in increments of 0.1 to 0.2 mg IV at 2-3 minute intervals, until desired degree of reversal.  . ropivacaine (PF) 2 mg/mL (0.2%) (NAROPIN) injection 9 mL  . triamcinolone acetonide (KENALOG-40) injection 40 mg   Medications administered: We administered  lidocaine, lactated ringers, midazolam, fentaNYL, ropivacaine (PF) 2 mg/mL (0.2%), and triamcinolone acetonide.  See the medical record for exact dosing, route, and time of administration.  Follow-up plan:   Return in about 2 weeks (around 08/18/2019) for (VV), (PP).       Interventional management options: Procedure(s) under consideration:  Diagnostic left-sided lumbar facet block #2 under fluoroscopic guidance and IV sedation  Possible left-sided lumbar facet RFA       Recent Visits Date Type Provider Dept  07/20/19 Office Visit Milinda Pointer, MD [facility]  Showing recent visits within past 90 days and meeting all other requirements Today's Visits Date Type Provider Dept  08/04/19 Procedure visit Milinda Pointer, MD [facility]  Showing today's visits and meeting all other requirements Future Appointments Date Type Provider Dept  08/24/19 Appointment Milinda Pointer, MD [facility]  Showing future appointments within next 90 days and meeting all other requirements  Disposition: Discharge home  Discharge (Date  Time): 08/04/2019; 1128 hrs.   Primary Care Physician: Marval Regal, NP Location: [facility] Note by: Gaspar Cola, MD Date: 08/04/2019; Time: 5:41 PM  Disclaimer:  Medicine is not an Chief Strategy Officer. The only guarantee in medicine is that nothing is guaranteed. It is important to note that the decision to proceed with this intervention was based on the information collected from the patient. The Data and conclusions were drawn from the patient's questionnaire, the interview, and the physical examination. Because the information was provided in large part by the patient, it cannot be guaranteed that it has not been purposely or unconsciously manipulated. Every effort has been made to obtain as much relevant data as possible for this evaluation. It is important to note that the conclusions that  lead to this procedure are derived in large part from the available data. Always take into account that the treatment will also be dependent on availability of resources and existing treatment guidelines, considered by other Pain Management Practitioners as being common knowledge and practice, at the time of the intervention. For Medico-Legal purposes, it is also important to point out that variation in procedural techniques and pharmacological choices are the acceptable norm. The indications, contraindications, technique, and results of the above procedure should only be interpreted and judged by a Board-Certified Interventional Pain Specialist with extensive familiarity and expertise in the same exact procedure and technique.

## 2019-08-04 NOTE — Patient Instructions (Signed)

## 2019-08-05 ENCOUNTER — Telehealth: Payer: Self-pay

## 2019-08-05 ENCOUNTER — Other Ambulatory Visit: Payer: Self-pay | Admitting: Nurse Practitioner

## 2019-08-05 DIAGNOSIS — R12 Heartburn: Secondary | ICD-10-CM

## 2019-08-05 DIAGNOSIS — K219 Gastro-esophageal reflux disease without esophagitis: Secondary | ICD-10-CM

## 2019-08-05 DIAGNOSIS — Z1211 Encounter for screening for malignant neoplasm of colon: Secondary | ICD-10-CM

## 2019-08-05 NOTE — Telephone Encounter (Signed)
Post procedure phone call.  LM 

## 2019-08-05 NOTE — Telephone Encounter (Addendum)
She told me she had a colonoscopy done in [place] and she would never have another one.  We did discuss colonoscopy versus Cologuard and we left it: If you change your mind and decide to do colon cancer screening let us know.    PLAN: It sounds like she desires to have a GI consult for gastroenteritis. She can discuss the cc screening modality with GI as well. I will place the referral.

## 2019-08-05 NOTE — Addendum Note (Signed)
Addended by: Denice Paradise A on: 08/05/2019 11:49 AM   Modules accepted: Orders

## 2019-08-05 NOTE — Telephone Encounter (Signed)
Patient called today and states she was supposed to have a referral placed for GI for Gastroenteritis. I did not see anything in the [facility] note when she was seen except that she needed a colonoscopy done.

## 2019-08-11 ENCOUNTER — Inpatient Hospital Stay (HOSPITAL_BASED_OUTPATIENT_CLINIC_OR_DEPARTMENT_OTHER): Payer: Medicare Other | Admitting: Oncology

## 2019-08-11 ENCOUNTER — Other Ambulatory Visit: Payer: Self-pay

## 2019-08-11 ENCOUNTER — Encounter: Payer: Self-pay | Admitting: Oncology

## 2019-08-11 VITALS — BP 115/74 | HR 89 | Temp 98.0°F | Resp 16 | Wt 125.3 lb

## 2019-08-11 DIAGNOSIS — D75839 Thrombocytosis, unspecified: Secondary | ICD-10-CM

## 2019-08-11 DIAGNOSIS — R7989 Other specified abnormal findings of blood chemistry: Secondary | ICD-10-CM | POA: Diagnosis not present

## 2019-08-11 DIAGNOSIS — D473 Essential (hemorrhagic) thrombocythemia: Secondary | ICD-10-CM

## 2019-08-13 NOTE — Progress Notes (Signed)
Hematology/Oncology Consult note [facility]  Telephone:([phone] [phone]  Patient Care Team: Marval Regal, NP as PCP - General (Nurse Practitioner)   Name of the patient: Cynthia Mcpherson  [id number]  09-03-1960   Date of visit: 08/13/19  Diagnosis- thrombocytosis likely reactive  Chief complaint/ Reason for visit- discuss results of bloodwork  Heme/Onc history:  Patient is a 59 year old female with a past medical history significant for hypothyroidism.  She has been referred to [place] for thrombocytosis.  Most recent labs from 06/23/2019 showed white count of 8, H&H of 13.3/40 with an MCV of 83 and a platelet count of 508.  Prior to that her platelet count was 474 in September 2020.  Patient currently feels well and has occasional back pain and fatigue but denies other complaints.  Denies any prior history of thromboembolic events heart attacks or TIAs.  She is currently not taking any blood thinner.  Results of blood work from 07/26/2019 were as follows: CBC showed white count of 7.3, H&H of 13.7/41.8 and a platelet count of 432.  Ferritin was low normal at 23 and iron studies were normal.  JAK2, CAL R, MPL mutation was negative.  Smear review unremarkable.  ESR normal.  CMP normal except for mildly elevated ast and alt of 48 and 72 respectively  Interval history-history obtained with the help of an interpreter.  Patient is doing well and denies any complaints at this time  ECOG PS- 0 Pain scale- 0   Review of systems- Review of Systems  Constitutional: Negative for chills, fever, malaise/fatigue and weight loss.  HENT: Negative for congestion, ear discharge and nosebleeds.   Eyes: Negative for blurred vision.  Respiratory: Negative for cough, hemoptysis, sputum production, shortness of breath and wheezing.   Cardiovascular: Negative for chest pain, palpitations, orthopnea and claudication.  Gastrointestinal: Negative for abdominal pain,  blood in stool, constipation, diarrhea, heartburn, melena, nausea and vomiting.  Genitourinary: Negative for dysuria, flank pain, frequency, hematuria and urgency.  Musculoskeletal: Negative for back pain, joint pain and myalgias.  Skin: Negative for rash.  Neurological: Negative for dizziness, tingling, focal weakness, seizures, weakness and headaches.  Endo/Heme/Allergies: Does not bruise/bleed easily.  Psychiatric/Behavioral: Negative for depression and suicidal ideas. The patient does not have insomnia.       Allergies  Allergen Reactions  . Influenza Vaccines Hives and Rash  . Iodinated Diagnostic Agents     Rash and breathing problems  . Latex Hives and Rash  . Shellfish Allergy Hives and Rash     Past Medical History:  Diagnosis Date  . GERD (gastroesophageal reflux disease)   . Thyroid disease    hypothyroidism      Past Surgical History:  Procedure Laterality Date  . BACK SURGERY      Social History   Socioeconomic History  . Marital status: Married    Spouse name: Not on file  . Number of children: Not on file  . Years of education: Not on file  . Highest education level: Not on file  Occupational History  . Not on file  Tobacco Use  . Smoking status: Never Smoker  . Smokeless tobacco: Never Used  Substance and Sexual Activity  . Alcohol use: Not Currently  . Drug use: Never  . Sexual activity: Not on file  Other Topics Concern  . Not on file  Social History Narrative   Married lives with her husband, has 1 grown daughter.  Retired Marine scientist.  Daughter lives in  [place].    Social Determinants of Health   Financial Resource Strain:   . Difficulty of Paying Living Expenses:   Food Insecurity:   . Worried About Charity fundraiser in the Last Year:   . Arboriculturist in the Last Year:   Transportation Needs:   . Film/video editor (Medical):   Marland Kitchen Lack of Transportation (Non-Medical):   Physical Activity:   . Days of Exercise per Week:   .  Minutes of Exercise per Session:   Stress:   . Feeling of Stress :   Social Connections:   . Frequency of Communication with Friends and Family:   . Frequency of Social Gatherings with Friends and Family:   . Attends Religious Services:   . Active Member of Clubs or Organizations:   . Attends Archivist Meetings:   Marland Kitchen Marital Status:   Intimate Partner Violence:   . Fear of Current or Ex-Partner:   . Emotionally Abused:   Marland Kitchen Physically Abused:   . Sexually Abused:     Family History  Problem Relation Age of Onset  . Psoriasis Mother   . Multiple sclerosis Sister   . Stroke Sister   . Hypertension Brother      Current Outpatient Medications:  .  acetaminophen (TYLENOL) 500 MG tablet, Take 500 mg by mouth every 6 (six) hours as needed., Disp: , Rfl:  .  levothyroxine (SYNTHROID) 88 MCG tablet, Take 88 mcg by mouth daily., Disp: , Rfl:  .  Multiple Vitamins-Minerals (MULTIVITAMIN WITH MINERALS) tablet, Take 1 tablet by mouth daily., Disp: , Rfl:  .  omeprazole (PRILOSEC) 20 MG capsule, Take by mouth., Disp: , Rfl:  .  cyclobenzaprine (FLEXERIL) 10 MG tablet, Take 1 tablet (10 mg total) by mouth 3 (three) times daily as needed for muscle spasms., Disp: 90 tablet, Rfl: 5  Physical exam:  Vitals:   08/11/19 1015  BP: 115/74  Pulse: 89  Resp: 16  Temp: 98 F (36.7 C)  SpO2: 100%  Weight: 125 lb 4.8 oz (56.8 kg)   Physical Exam Constitutional:      General: She is not in acute distress. Pulmonary:     Effort: Pulmonary effort is normal.  Skin:    General: Skin is warm and dry.  Neurological:     Mental Status: She is alert and oriented to person, place, and time.      CMP Latest Ref Rng & Units 07/26/2019  Glucose 70 - 99 mg/dL 119(H)  BUN 6 - 20 mg/dL 15  Creatinine 0.44 - 1.00 mg/dL 0.63  Sodium 135 - 145 mmol/L 141  Potassium 3.5 - 5.1 mmol/L 4.3  Chloride 98 - 111 mmol/L 103  CO2 22 - 32 mmol/L 29  Calcium 8.9 - 10.3 mg/dL 9.5  Total Protein 6.5 -  8.1 g/dL 7.6  Total Bilirubin 0.3 - 1.2 mg/dL 0.6  Alkaline Phos 38 - 126 U/L 84  AST 15 - 41 U/L 48(H)  ALT 0 - 44 U/L 72(H)   CBC Latest Ref Rng & Units 07/26/2019  WBC 4.0 - 10.5 K/uL 7.3  Hemoglobin 12.0 - 15.0 g/dL 13.7  Hematocrit 36 - 46 % 41.8  Platelets 150 - 400 K/uL 432(H)    No images are attached to the encounter.  DG PAIN CLINIC C-ARM 1-60 MIN NO REPORT  Result Date: 08/04/2019 Fluoro was used, but no Radiologist interpretation will be provided. Please refer to "NOTES" tab for provider progress note.  Assessment and plan- Patient is a 59 y.o. female referred for thrombocytosis likely reactive  atient has mild isolated thrombocytosis and her most recent platelet count was 432.  JAK2, CAL R, MPL mutation testing negative.  Iron studies show mildly low ferritin levels of 23 and iron deficiency can sometimes be associated with thrombocytosis although patient is on oral iron once or twice a day as tolerated.  At this time I am inclined to monitor her platelet count conservatively without the need for bone marrow biopsy.  There is a consistent increase in her platelet count I will consider doing that down the line.   Visit Diagnosis 1. Thrombocytosis ([place])      Dr. Randa Evens, MD, MPH [facility] [phone] 08/13/2019 8:20 AM

## 2019-08-13 NOTE — Progress Notes (Signed)
I placed Referral to GI.

## 2019-08-18 ENCOUNTER — Encounter: Payer: Self-pay | Admitting: *Deleted

## 2019-08-23 ENCOUNTER — Telehealth: Payer: Self-pay | Admitting: *Deleted

## 2019-08-23 NOTE — Progress Notes (Signed)
Patient: Cynthia Mcpherson  Service Category: E/M  Provider: Gaspar Cola, MD  DOB: 12/23/1960  DOS: 08/24/2019  Location: Office  MRN: [id number]  Setting: Ambulatory outpatient  Referring Provider: Marval Regal, NP  Type: Established Patient  Specialty: Interventional Pain Management  PCP: Marval Regal, NP  Location: Remote location  Delivery: TeleHealth     Virtual Encounter - Pain Management PROVIDER NOTE: Information contained herein reflects review and annotations entered in association with encounter. Interpretation of such information and data should be left to medically-trained personnel. Information provided to patient can be located elsewhere in the medical record under "Patient Instructions". Document created using STT-dictation technology, any transcriptional errors that may result from process are unintentional.    Contact & Pharmacy Preferred: [phone] Home: [phone] (home) Mobile: [phone] (mobile) E-mail: No e-mail address on record  [facility] [place] [place] [facility] [place] [address] [place] [address] Phone: [phone] Fax: [phone]   Pre-screening  Cynthia Mcpherson offered "in-person" vs "virtual" encounter. She indicated preferring virtual for this encounter.   Reason COVID-19*  Social distancing based on CDC and AMA recommendations.   I contacted Cynthia Mcpherson on 08/24/2019 via telephone.      I clearly identified myself as Gaspar Cola, MD. I verified that I was speaking with the correct person using two identifiers (Name: Cynthia Mcpherson, and date of birth: 09-02-1960).  Consent I sought verbal advanced consent from Cynthia Mcpherson for virtual visit interactions. I informed Cynthia Mcpherson of possible security and privacy concerns, risks, and limitations associated with providing "not-in-person" medical evaluation and management  services. I also informed Cynthia Mcpherson of the availability of "in-person" appointments. Finally, I informed her that there would be a charge for the virtual visit and that she could be  personally, fully or partially, financially responsible for it. Cynthia Mcpherson expressed understanding and agreed to proceed.   Historic Elements   [name] is a 59 y.o. year old, female patient evaluated today after her last contact with our practice on 08/23/2019. Cynthia Mcpherson  has a past medical history of GERD (gastroesophageal reflux disease) and Thyroid disease. She also  has a past surgical history that includes Back surgery. Cynthia Mcpherson has a current medication list which includes the following prescription(s): acetaminophen, levothyroxine, multivitamin with minerals, omeprazole, and cyclobenzaprine. She  reports that she has never smoked. She has never used smokeless tobacco. She reports previous alcohol use. She reports that she does not use drugs. Cynthia Mcpherson is allergic to influenza vaccines, iodinated diagnostic agents, latex, and shellfish allergy.   HPI  Today, she is being contacted for a post-procedure assessment.  She indicates that this lumbar facet block is working similar to the first one.  Immediately after the procedure she tends to have some discomfort from the needle sticks, but over time he continues to improve. The first diagnostic lumbar facet block done on 01/06/2019 provided her with complete relief of the pain until April 2021.  Today she indicates that most recent procedure done on 08/04/2019 took until 08/20/2019 to provide her with 100% relief of the pain, but she refers that for the past 4 days she has had absolutely no pain in her back.  She is currently in [place] state on vacation and this may have something to do with that.  Today the patient also asked several questions about the facet joint syndrome and what to expect, all  of which were answered for her.  I also told her that as long  as she keeps getting more than 4 months of pain relief from the block, we could continue doing those for the foreseeable future.  In addition, today I gave her some pointers as to the type of activity that usually would flareup this pain so that she could avoid it.  She also asked about the use of an abdominal binder and I have recommended that she get one at a hardware store and use it only when she is doing activities that she knows will irritate her back but that she should not use it all the time.  She understood and accepted.  Today I also spoke to her about all turn of this such as the radiofrequency ablation.  Again she understood and for the time being we will see how she does with the second block and we will see how long it lasts.  She indicated that she would give me a call when she needs me again.  Post-Procedure Evaluation  Procedure: (08/04/2019) Diagnostic left lumbar facet block #2 under fluoroscopic guidance and IV sedation Pre-procedure pain level: 2/10 Post-procedure: 0/10 (100% relief)  Sedation: Sedation provided.  Effectiveness during initial hour after procedure(Ultra-Short Term Relief): 100 %.  Local anesthetic used: Long-acting (4-6 hours) Effectiveness: Defined as any analgesic benefit obtained secondary to the administration of local anesthetics. This carries significant diagnostic value as to the etiological location, or anatomical origin, of the pain. Duration of benefit is expected to coincide with the duration of the local anesthetic used.  Effectiveness during initial 4-6 hours after procedure(Short-Term Relief): 100 %.  Long-term benefit: Defined as any relief past the pharmacologic duration of the local anesthetics.  Effectiveness past the initial 6 hours after procedure(Long-Term Relief): 100 %.  Current benefits: Defined as benefit that persist at this time.   Analgesia:  90-100% better Function: Cynthia Mcpherson reports improvement in function ROM: Cynthia Mcpherson reports  improvement in ROM  Pharmacotherapy Assessment  Analgesic: No opioid analgesics prescribed by our practice.   Monitoring: [place] PMP: PDMP reviewed during this encounter.       Pharmacotherapy: No side-effects or adverse reactions reported. Compliance: No problems identified. Effectiveness: Clinically acceptable. Plan: Refer to "POC".  UDS: No results found for: SUMMARY  Laboratory Chemistry Profile   Renal Lab Results  Component Value Date   BUN 15 07/26/2019   CREATININE 0.63 07/26/2019   GFR 85.27 06/23/2019   GFRAA >60 07/26/2019   GFRNONAA >60 07/26/2019     Hepatic Lab Results  Component Value Date   AST 48 (H) 07/26/2019   ALT 72 (H) 07/26/2019   ALBUMIN 4.5 07/26/2019   ALKPHOS 84 07/26/2019     Electrolytes Lab Results  Component Value Date   NA 141 07/26/2019   K 4.3 07/26/2019   CL 103 07/26/2019   CALCIUM 9.5 07/26/2019     Bone No results found for: VD25OH, VD125OH2TOT, ML4650PT4, SF6812XN1, 25OHVITD1, 25OHVITD2, 25OHVITD3, TESTOFREE, TESTOSTERONE   Inflammation (CRP: Acute Phase) (ESR: Chronic Phase) Lab Results  Component Value Date   ESRSEDRATE 6 07/26/2019       Note: Above Lab results reviewed.  Imaging  DG PAIN CLINIC C-ARM 1-60 MIN NO REPORT Fluoro was used, but no Radiologist interpretation will be provided.  Please refer to "NOTES" tab for provider progress note.  Assessment  The primary encounter diagnosis was Chronic pain syndrome. Diagnoses of Chronic low back pain (Primary Area of Pain) (Left) w/o sciatica,  Lumbar facet syndrome (Left), Lumbar facet arthropathy, and Failed back surgical syndrome were also pertinent to this visit.  Plan of Care  Problem-specific:  No problem-specific Assessment & Plan notes found for this encounter.  Ms. Brittain Smithey has a current medication list which includes the following long-term medication(s): cyclobenzaprine, levothyroxine, and omeprazole.  Pharmacotherapy (Medications  Ordered): No orders of the defined types were placed in this encounter.  Orders:  No orders of the defined types were placed in this encounter.  Follow-up plan:   No follow-ups on file.      Interventional management options: Procedure(s) under consideration:  Diagnostic left-sided lumbar facet block #3 under fluoroscopic guidance and IV sedation  Possible left-sided lumbar facet RFA     Recent Visits Date Type Provider Dept  08/04/19 Procedure visit Milinda Pointer, MD [facility]  07/20/19 Office Visit Milinda Pointer, MD [facility]  Showing recent visits within past 90 days and meeting all other requirements Today's Visits Date Type Provider Dept  08/24/19 Telemedicine Milinda Pointer, MD [facility]  Showing today's visits and meeting all other requirements Future Appointments No visits were found meeting these conditions. Showing future appointments within next 90 days and meeting all other requirements  I discussed the assessment and treatment plan with the patient. The patient was provided an opportunity to ask questions and all were answered. The patient agreed with the plan and demonstrated an understanding of the instructions.  Patient advised to call back or seek an in-person evaluation if the symptoms or condition worsens.  Duration of encounter: 30 minutes.  Note by: Gaspar Cola, MD Date: 08/24/2019; Time: 2:46 PM

## 2019-08-23 NOTE — Telephone Encounter (Signed)
Attempted to call for pre appointment review of allergies/meds. Message left. 

## 2019-08-24 ENCOUNTER — Telehealth: Payer: Self-pay | Admitting: *Deleted

## 2019-08-24 ENCOUNTER — Other Ambulatory Visit: Payer: Self-pay

## 2019-08-24 ENCOUNTER — Encounter: Payer: Self-pay | Admitting: Pain Medicine

## 2019-08-24 ENCOUNTER — Ambulatory Visit: Payer: Medicare Other | Attending: Pain Medicine | Admitting: Pain Medicine

## 2019-08-24 DIAGNOSIS — G8929 Other chronic pain: Secondary | ICD-10-CM

## 2019-08-24 DIAGNOSIS — M47816 Spondylosis without myelopathy or radiculopathy, lumbar region: Secondary | ICD-10-CM

## 2019-08-24 DIAGNOSIS — M961 Postlaminectomy syndrome, not elsewhere classified: Secondary | ICD-10-CM | POA: Diagnosis not present

## 2019-08-24 DIAGNOSIS — M545 Low back pain, unspecified: Secondary | ICD-10-CM

## 2019-08-24 DIAGNOSIS — G894 Chronic pain syndrome: Secondary | ICD-10-CM

## 2019-09-05 ENCOUNTER — Other Ambulatory Visit: Payer: TRICARE For Life (TFL)

## 2019-09-23 ENCOUNTER — Telehealth: Payer: Self-pay | Admitting: Nurse Practitioner

## 2019-09-23 NOTE — Telephone Encounter (Signed)
Rejection Reason - Patient did not respond - Mailed "unable to contact" letter to home and referring provider. Per Alver Sorrow 08/18/2019" [facility] said 7 days ago

## 2019-11-11 ENCOUNTER — Ambulatory Visit: Payer: Medicare Other | Admitting: Oncology

## 2019-11-11 ENCOUNTER — Other Ambulatory Visit: Payer: Medicare Other

## 2019-11-15 ENCOUNTER — Inpatient Hospital Stay: Payer: Medicare Other

## 2019-11-15 ENCOUNTER — Other Ambulatory Visit: Payer: Self-pay

## 2019-11-15 ENCOUNTER — Inpatient Hospital Stay (HOSPITAL_BASED_OUTPATIENT_CLINIC_OR_DEPARTMENT_OTHER): Payer: Medicare Other | Admitting: Oncology

## 2019-11-15 ENCOUNTER — Inpatient Hospital Stay: Payer: Medicare Other | Admitting: Oncology

## 2019-11-15 ENCOUNTER — Encounter: Payer: Self-pay | Admitting: Oncology

## 2019-11-15 ENCOUNTER — Inpatient Hospital Stay: Payer: Medicare Other | Attending: Oncology

## 2019-11-15 VITALS — BP 114/91 | HR 93 | Temp 98.3°F | Resp 16 | Ht 60.0 in | Wt 127.1 lb

## 2019-11-15 DIAGNOSIS — K219 Gastro-esophageal reflux disease without esophagitis: Secondary | ICD-10-CM | POA: Insufficient documentation

## 2019-11-15 DIAGNOSIS — Z79899 Other long term (current) drug therapy: Secondary | ICD-10-CM | POA: Diagnosis not present

## 2019-11-15 DIAGNOSIS — D7589 Other specified diseases of blood and blood-forming organs: Secondary | ICD-10-CM | POA: Insufficient documentation

## 2019-11-15 DIAGNOSIS — E039 Hypothyroidism, unspecified: Secondary | ICD-10-CM | POA: Insufficient documentation

## 2019-11-15 DIAGNOSIS — Z8249 Family history of ischemic heart disease and other diseases of the circulatory system: Secondary | ICD-10-CM | POA: Diagnosis not present

## 2019-11-15 DIAGNOSIS — D473 Essential (hemorrhagic) thrombocythemia: Secondary | ICD-10-CM | POA: Diagnosis not present

## 2019-11-15 DIAGNOSIS — D75839 Thrombocytosis, unspecified: Secondary | ICD-10-CM

## 2019-11-15 LAB — COMPREHENSIVE METABOLIC PANEL
ALT: 42 U/L (ref 0–44)
AST: 35 U/L (ref 15–41)
Albumin: 4.5 g/dL (ref 3.5–5.0)
Alkaline Phosphatase: 81 U/L (ref 38–126)
Anion gap: 11 (ref 5–15)
BUN: 13 mg/dL (ref 6–20)
CO2: 29 mmol/L (ref 22–32)
Calcium: 9.5 mg/dL (ref 8.9–10.3)
Chloride: 100 mmol/L (ref 98–111)
Creatinine, Ser: 0.83 mg/dL (ref 0.44–1.00)
GFR calc Af Amer: >60 mL/min (ref >60)
GFR calc non Af Amer: >60 mL/min (ref >60)
Glucose, Bld: 109 mg/dL — ABNORMAL HIGH (ref 70–99)
Potassium: 3.6 mmol/L (ref 3.5–5.1)
Sodium: 140 mmol/L (ref 135–145)
Total Bilirubin: 0.4 mg/dL (ref 0.3–1.2)
Total Protein: 7.7 g/dL (ref 6.5–8.1)

## 2019-11-15 LAB — CBC WITH DIFFERENTIAL/PLATELET
Abs Immature Granulocytes: 0.02 10*3/uL (ref 0.00–0.07)
Basophils Absolute: 0 10*3/uL (ref 0.0–0.1)
Basophils Relative: 1 %
Eosinophils Absolute: 0.1 10*3/uL (ref 0.0–0.5)
Eosinophils Relative: 2 %
HCT: 42.9 % (ref 36.0–46.0)
Hemoglobin: 14.3 g/dL (ref 12.0–15.0)
Immature Granulocytes: 0 %
Lymphocytes Relative: 31 %
Lymphs Abs: 2.3 10*3/uL (ref 0.7–4.0)
MCH: 27.9 pg (ref 26.0–34.0)
MCHC: 33.3 g/dL (ref 30.0–36.0)
MCV: 83.8 fL (ref 80.0–100.0)
Monocytes Absolute: 0.5 10*3/uL (ref 0.1–1.0)
Monocytes Relative: 6 %
Neutro Abs: 4.5 10*3/uL (ref 1.7–7.7)
Neutrophils Relative %: 60 %
Platelets: 425 10*3/uL — ABNORMAL HIGH (ref 150–400)
RBC: 5.12 MIL/uL — ABNORMAL HIGH (ref 3.87–5.11)
RDW: 15.2 % (ref 11.5–15.5)
WBC: 7.3 10*3/uL (ref 4.0–10.5)
nRBC: 0 % (ref 0.0–0.2)

## 2019-11-20 NOTE — Progress Notes (Signed)
Hematology/Oncology Consult note [facility]  Telephone:([phone] [phone]  Patient Care Team: Marval Regal, NP as PCP - General (Nurse Practitioner)   Name of the patient: Cynthia Mcpherson  [id number]  1960/02/28   Date of visit: 11/20/19  Diagnosis- thrombocytosis likely reactive  Chief complaint/ Reason for visit- discuss results of bloodwork  Heme/Onc history: Patient is a 59 year old female with a past medical history significant for hypothyroidism. She has been referred to [place] for thrombocytosis. Most recent labs from 06/23/2019 showed white count of 8, H&H of 13.3/40 with an MCV of 83 and a platelet count of 508. Prior to that her platelet count was 474 in September 2020. Patient currently feels well and has occasional back pain and fatigue but denies other complaints. Denies any prior history of thromboembolic events heart attacks or TIAs. She is currently not taking any blood thinner.  Results of blood work from 07/26/2019 were as follows: CBC showed white count of 7.3, H&H of 13.7/41.8 and a platelet count of 432.  Ferritin was low normal at 23 and iron studies were normal.  JAK2, CAL R, MPL mutation was negative.  Smear review unremarkable.  ESR normal.  CMP normal except for mildly elevated ast and alt of 48 and 72 respectively   Interval history- Patient feels well. Denies any complaints at this time  ECOG PS- 0 Pain scale- 0   Review of systems- Review of Systems  Constitutional: Negative for chills, fever, malaise/fatigue and weight loss.  HENT: Negative for congestion, ear discharge and nosebleeds.   Eyes: Negative for blurred vision.  Respiratory: Negative for cough, hemoptysis, sputum production, shortness of breath and wheezing.   Cardiovascular: Negative for chest pain, palpitations, orthopnea and claudication.  Gastrointestinal: Negative for abdominal pain, blood in stool, constipation, diarrhea, heartburn,  melena, nausea and vomiting.  Genitourinary: Negative for dysuria, flank pain, frequency, hematuria and urgency.  Musculoskeletal: Negative for back pain, joint pain and myalgias.  Skin: Negative for rash.  Neurological: Negative for dizziness, tingling, focal weakness, seizures, weakness and headaches.  Endo/Heme/Allergies: Does not bruise/bleed easily.  Psychiatric/Behavioral: Negative for depression and suicidal ideas. The patient does not have insomnia.       Allergies  Allergen Reactions  . Influenza Vaccines Hives and Rash  . Iodinated Diagnostic Agents     Rash and breathing problems  . Latex Hives and Rash  . Shellfish Allergy Hives and Rash  . Iodine Itching and Rash     Past Medical History:  Diagnosis Date  . GERD (gastroesophageal reflux disease)   . Thrombocytosis ([place])   . Thyroid disease    hypothyroidism      Past Surgical History:  Procedure Laterality Date  . BACK SURGERY      Social History   Socioeconomic History  . Marital status: Married    Spouse name: Not on file  . Number of children: Not on file  . Years of education: Not on file  . Highest education level: Not on file  Occupational History  . Not on file  Tobacco Use  . Smoking status: Never Smoker  . Smokeless tobacco: Never Used  Substance and Sexual Activity  . Alcohol use: Not Currently  . Drug use: Never  . Sexual activity: Yes  Other Topics Concern  . Not on file  Social History Narrative   Married lives with her husband, has 1 grown daughter.  Retired Marine scientist.  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    Social Determinants  of Health   Financial Resource Strain:   . Difficulty of Paying Living Expenses: Not on file  Food Insecurity:   . Worried About Charity fundraiser in the Last Year: Not on file  . Ran Out of Food in the Last Year: Not on file  Transportation Needs:   . Lack of Transportation (Medical): Not on file  . Lack of Transportation (Non-Medical): Not on file    Physical Activity:   . Days of Exercise per Week: Not on file  . Minutes of Exercise per Session: Not on file  Stress:   . Feeling of Stress : Not on file  Social Connections:   . Frequency of Communication with Friends and Family: Not on file  . Frequency of Social Gatherings with Friends and Family: Not on file  . Attends Religious Services: Not on file  . Active Member of Clubs or Organizations: Not on file  . Attends Archivist Meetings: Not on file  . Marital Status: Not on file  Intimate Partner Violence:   . Fear of Current or Ex-Partner: Not on file  . Emotionally Abused: Not on file  . Physically Abused: Not on file  . Sexually Abused: Not on file    Family History  Problem Relation Age of Onset  . Psoriasis Mother   . Multiple sclerosis Sister   . Stroke Sister   . Hypertension Brother      Current Outpatient Medications:  .  acetaminophen (TYLENOL) 500 MG tablet, Take 500 mg by mouth every 6 (six) hours as needed., Disp: , Rfl:  .  cyclobenzaprine (FLEXERIL) 10 MG tablet, Take 1 tablet (10 mg total) by mouth 3 (three) times daily as needed for muscle spasms., Disp: 90 tablet, Rfl: 5 .  ferrous sulfate 325 (65 FE) MG tablet, Take 325 mg by mouth daily with breakfast., Disp: , Rfl:  .  levothyroxine (SYNTHROID) 88 MCG tablet, Take 88 mcg by mouth daily., Disp: , Rfl:  .  Multiple Vitamins-Minerals (MULTIVITAMIN WITH MINERALS) tablet, Take 1 tablet by mouth daily., Disp: , Rfl:  .  omeprazole (PRILOSEC) 20 MG capsule, Take 20 mg by mouth daily as needed. , Disp: , Rfl:   Physical exam:  Vitals:   11/15/19 1058  BP: (!) 114/91  Pulse: 93  Resp: 16  Temp: 98.3 F (36.8 C)  TempSrc: Oral  Weight: 127 lb 1.6 oz (57.7 kg)  Height: 5' (1.524 m)   Physical Exam   CMP Latest Ref Rng & Units 11/15/2019  Glucose 70 - 99 mg/dL 109(H)  BUN 6 - 20 mg/dL 13  Creatinine 0.44 - 1.00 mg/dL 0.83  Sodium 135 - 145 mmol/L 140  Potassium 3.5 - 5.1 mmol/L 3.6   Chloride 98 - 111 mmol/L 100  CO2 22 - 32 mmol/L 29  Calcium 8.9 - 10.3 mg/dL 9.5  Total Protein 6.5 - 8.1 g/dL 7.7  Total Bilirubin 0.3 - 1.2 mg/dL 0.4  Alkaline Phos 38 - 126 U/L 81  AST 15 - 41 U/L 35  ALT 0 - 44 U/L 42   CBC Latest Ref Rng & Units 11/15/2019  WBC 4.0 - 10.5 K/uL 7.3  Hemoglobin 12.0 - 15.0 g/dL 14.3  Hematocrit 36 - 46 % 42.9  Platelets 150 - 400 K/uL 425(H)      Assessment and plan- Patient is a 59 y.o. female here for routine f/u of thrombocytosis   Platelet counts have been fluctuating mostly in the 400's. Wbc and hb normal. Jak2,  calr and mpl negative. I will consider bone marrow biopsy if there is a consistent increase in her counts. It is not warranted at present.  I will see her in 6 months with labs   Visit Diagnosis 1. Thrombocytosis ([place])      Dr. Randa Evens, MD, MPH [facility] [phone] 11/20/2019 11:03 AM

## 2019-11-29 NOTE — Progress Notes (Signed)
PROVIDER NOTE: Information contained herein reflects review and annotations entered in association with encounter. Interpretation of such information and data should be left to medically-trained personnel. Information provided to patient can be located elsewhere in the medical record under "Patient Instructions". Document created using STT-dictation technology, any transcriptional errors that may result from process are unintentional.    Patient: Cynthia Mcpherson  Service Category: E/M  Provider: Gaspar Cola, MD  DOB: 1960/06/12  DOS: 11/30/2019  Specialty: Interventional Pain Management  MRN: [id number]  Setting: Ambulatory outpatient  PCP: Cynthia Regal, NP  Type: Established Patient    Referring Provider: Marval Regal, NP  Location: Office  Delivery: Face-to-face     HPI  [name], a 59 y.o. year old female, is here today because of her Chronic pain syndrome [G89.4]. [name] primary complain today is Back Pain (lumbar left side ) Last encounter: My last encounter with her was on 08/04/2019. Pertinent problems: [name] has Lumbar radiculopathy; Chronic low back pain (Primary Area of Pain) (Left) w/o sciatica; Chronic hip pain (Left); Lumbar facet syndrome (Left); Failed back surgical syndrome; Lumbar facet arthropathy; Spondylosis without myelopathy or radiculopathy, lumbosacral region; DDD (degenerative disc disease), lumbosacral; Chronic pain syndrome; and Chronic musculoskeletal pain on their pertinent problem list. Pain Assessment: Severity of Chronic pain is reported as a 5 /10. Location: Back Lower, Left/down the back of left leg. Onset: More than a month ago. Quality: Burning, Constant, Discomfort, Stabbing. Timing: Constant. Modifying factor(s): cold packs, medications. Vitals:  height is 5' (1.524 m) and weight is 125 lb (56.7 kg). Her temporal temperature is 97.5 F (36.4 C) (abnormal). Her blood pressure is 110/76 and her pulse is 104 (abnormal).  Her respiration is 16 and oxygen saturation is 100%.   Reason for encounter: follow-up evaluation.  The patient has had 2 lumbar facet blocks with 100% relief of the pain. Her last diagnostic injection was done on 08/04/2019 and I provided her with complete relief of the pain until recently when the pain started to come back. The patient has already had physical therapy and we have completed her 2 diagnostic lumbar facet injections with 100% relief of the pain, unfortunately the pain continues to return and therefore we will be scheduling her for radiofrequency ablation in the hopes that we can get longer lasting benefit. Today I have explained to the patient the procedure itself including risk and possible complications. The patient understood the plan and agrees.  None opioid transfer: Flexeril.  Medical Necessity: [name] has experienced debilitating chronic pain from the Lumbosacral Facet Syndrome (Spondylosis without myelopathy or radiculopathy, lumbosacral region [M47.817]) that has persisted for longer than three months of failed non-surgical care and has either failed to respond, or was unable to tolerate, or simply did not get enough benefit from other more conservative therapies including, but not limited to: 1. Over-the-counter oral analgesic medications (i.e.: ibuprofen, naproxen, etc.) 2. Anti-inflammatory medications 3. Muscle relaxants 4. Membrane stabilizers 5. Opioids 6. Physical therapy (PT), chiropractic manipulation, and/or home exercise program (HEP). 7. Modalities (Heat, ice, etc.) 8. Invasive techniques such as nerve blocks.  [name] has attained greater than 50% reduction in pain from at least two (2) diagnostic medial branch blocks conducted in separate occasions. For this reason, I believe it is medically necessary to proceed with Non-Pulsed Radiofrequency Ablation for the purpose of attempting to prolong the duration of the benefits seen with the diagnostic  injections.  Pharmacotherapy Assessment   Analgesic: No opioid analgesics prescribed  by our practice.   Monitoring:  PMP: PDMP reviewed during this encounter.       Pharmacotherapy: No side-effects or adverse reactions reported. Compliance: No problems identified. Effectiveness: Clinically acceptable.  Janett Billow, RN  11/30/2019 10:15 AM  Sign when Signing Visit Safety precautions to be maintained throughout the outpatient stay will include: orient to surroundings, keep bed in low position, maintain call bell within reach at all times, provide assistance with transfer out of bed and ambulation.     UDS: No results found for: SUMMARY   ROS  Constitutional: Denies any fever or chills Gastrointestinal: No reported hemesis, hematochezia, vomiting, or acute GI distress Musculoskeletal: Denies any acute onset joint swelling, redness, loss of ROM, or weakness Neurological: No reported episodes of acute onset apraxia, aphasia, dysarthria, agnosia, amnesia, paralysis, loss of coordination, or loss of consciousness  Medication Review  Levothyroxine Sodium, acetaminophen, cyclobenzaprine, ferrous sulfate, levothyroxine, multivitamin with minerals, and omeprazole  History Review  Allergy: [name] is allergic to influenza vaccines, iodinated diagnostic agents, latex, shellfish allergy, and iodine. Drug: [name]  reports no history of drug use. Alcohol:  reports previous alcohol use. Tobacco:  reports that she has never smoked. She has never used smokeless tobacco. Social: [name]  reports that she has never smoked. She has never used smokeless tobacco. She reports previous alcohol use. She reports that she does not use drugs. Medical:  has a past medical history of GERD (gastroesophageal reflux disease), Thrombocytosis, and Thyroid disease. Surgical: [name]  has a past surgical history that includes Back surgery. Family: family history includes Hypertension in her  brother; Multiple sclerosis in her sister; Psoriasis in her mother; Stroke in her sister.  Laboratory Chemistry Profile   Renal Lab Results  Component Value Date   BUN 13 11/15/2019   CREATININE 0.83 11/15/2019   GFR 85.27 06/23/2019   GFRAA >60 11/15/2019   GFRNONAA >60 11/15/2019     Hepatic Lab Results  Component Value Date   AST 35 11/15/2019   ALT 42 11/15/2019   ALBUMIN 4.5 11/15/2019   ALKPHOS 81 11/15/2019     Electrolytes Lab Results  Component Value Date   NA 140 11/15/2019   K 3.6 11/15/2019   CL 100 11/15/2019   CALCIUM 9.5 11/15/2019     Bone No results found for: VD25OH, VD125OH2TOT, PJ0932IZ1, IW5809XI3, 25OHVITD1, 25OHVITD2, 25OHVITD3, TESTOFREE, TESTOSTERONE   Inflammation (CRP: Acute Phase) (ESR: Chronic Phase) Lab Results  Component Value Date   ESRSEDRATE 6 07/26/2019       Note: Above Lab results reviewed.  Recent Imaging Review  DG PAIN CL[facility] C-ARM 1-60 MIN NO REPORT Fluoro was used, but no Radiologist interpretation will be provided.  Please refer to "NOTES" tab for provider progress note. Note: Reviewed        Physical Exam  General appearance: Well nourished, well developed, and well hydrated. In no apparent acute distress Mental status: Alert, oriented x 3 (person, place, & time)       Respiratory: No evidence of acute respiratory distress Eyes: PERLA Vitals: BP 110/76 (BP Location: Right Arm, Patient Position: Sitting, Cuff Size: Normal)   Pulse (!) 104   Temp (!) 97.5 F (36.4 C) (Temporal)   Resp 16   Ht 5' (1.524 m)   Wt 125 lb (56.7 kg)   SpO2 100%   BMI 24.41 kg/m  BMI: Estimated body mass index is 24.41 kg/m as calculated from the following:   Height as of this encounter: 5' (  1.524 m).   Weight as of this encounter: 125 lb (56.7 kg). Ideal: Ideal body weight: 45.5 kg (100 lb 4.9 oz) Adjusted ideal body weight: 50 kg (110 lb 3 oz)  Assessment   Status Diagnosis  Controlled Controlled Controlled 1. Chronic pain  syndrome   2. Chronic low back pain (Primary Area of Pain) (Left) w/o sciatica   3. Lumbar facet syndrome (Left)   4. Failed back surgical syndrome   5. Chronic musculoskeletal pain      Updated Problems: No problems updated.  Plan of Care  Problem-specific:  No problem-specific Assessment & Plan notes found for this encounter.  Ms. Ailany Koren has a current medication list which includes the following long-term medication(s): cyclobenzaprine, ferrous sulfate, levothyroxine sodium, omeprazole, cyclobenzaprine, and levothyroxine.  Pharmacotherapy (Medications Ordered): Meds ordered this encounter  Medications  . cyclobenzaprine (FLEXERIL) 10 MG tablet    Sig: Take 1 tablet (10 mg total) by mouth 3 (three) times daily as needed for muscle spasms.    Dispense:  270 tablet    Refill:  0    Fill one day early if pharmacy is closed on scheduled refill date. May substitute for generic if available.   Orders:  Orders Placed This Encounter  Procedures  . Radiofrequency,Lumbar    Standing Status:   Future    Standing Expiration Date:   11/29/2020    Scheduling Instructions:     Side(s): Left-sided     Level: L3-4, L4-5, & L5-S1 Facets (L2, L3, L4, L5, & S1 Medial Branch Nerves)     Sedation: Patient's choice.     Scheduling Timeframe: As soon as pre-approved    Order Specific Question:   Where will this procedure be performed?    Answer:   [facility]   Follow-up plan:   Return for Radio-Frequency: (L) L-FCT RFA #1.      Interventional management options: Procedure(s) under consideration:  Diagnostic left-sided lumbar facet block #3 under fluoroscopic guidance and IV sedation  Possible left-sided lumbar facet RFA      Recent Visits No visits were found meeting these conditions. Showing recent visits within past 90 days and meeting all other requirements Today's Visits Date Type Provider Dept  11/30/19 Office Visit Milinda Pointer, MD [facility]  Showing today's visits and meeting all other requirements Future Appointments No visits were found meeting these conditions. Showing future appointments within next 90 days and meeting all other requirements  I discussed the assessment and treatment plan with the patient. The patient was provided an opportunity to ask questions and all were answered. The patient agreed with the plan and demonstrated an understanding of the instructions.  Patient advised to call back or seek an in-person evaluation if the symptoms or condition worsens.  Duration of encounter: 30 minutes.  Note by: Gaspar Cola, MD Date: 11/30/2019; Time: 10:55 AM

## 2019-11-30 ENCOUNTER — Encounter: Payer: Self-pay | Admitting: Pain Medicine

## 2019-11-30 ENCOUNTER — Ambulatory Visit: Payer: Medicare Other | Attending: Pain Medicine | Admitting: Pain Medicine

## 2019-11-30 ENCOUNTER — Other Ambulatory Visit: Payer: Self-pay

## 2019-11-30 VITALS — BP 110/76 | HR 104 | Temp 97.5°F | Resp 16 | Ht 60.0 in | Wt 125.0 lb

## 2019-11-30 DIAGNOSIS — G8929 Other chronic pain: Secondary | ICD-10-CM | POA: Diagnosis present

## 2019-11-30 DIAGNOSIS — M47816 Spondylosis without myelopathy or radiculopathy, lumbar region: Secondary | ICD-10-CM | POA: Insufficient documentation

## 2019-11-30 DIAGNOSIS — M545 Low back pain, unspecified: Secondary | ICD-10-CM | POA: Diagnosis not present

## 2019-11-30 DIAGNOSIS — M7918 Myalgia, other site: Secondary | ICD-10-CM

## 2019-11-30 DIAGNOSIS — M961 Postlaminectomy syndrome, not elsewhere classified: Secondary | ICD-10-CM | POA: Diagnosis not present

## 2019-11-30 DIAGNOSIS — G894 Chronic pain syndrome: Secondary | ICD-10-CM | POA: Diagnosis present

## 2019-11-30 MED ORDER — CYCLOBENZAPRINE HCL 10 MG PO TABS: 10.0000 mg | ORAL_TABLET | Freq: Three times a day (TID) | ORAL | 0 refills | Status: DC | PRN

## 2019-11-30 NOTE — Patient Instructions (Addendum)
____________________________________________________________________________________________  Preparing for Procedure with Sedation  Procedure appointments are limited to planned procedures: . No Prescription Refills. . No disability issues will be discussed. . No medication changes will be discussed.  Instructions: . Oral Intake: Do not eat or drink anything for at least 8 hours prior to your procedure. (Exception: Blood Pressure Medication. See below.) . Transportation: Unless otherwise stated by your physician, you may drive yourself after the procedure. . Blood Pressure Medicine: Do not forget to take your blood pressure medicine with a sip of water the morning of the procedure. If your Diastolic (lower reading)is above 100 mmHg, elective cases will be cancelled/rescheduled. . Blood thinners: These will need to be stopped for procedures. Notify our staff if you are taking any blood thinners. Depending on which one you take, there will be specific instructions on how and when to stop it. . Diabetics on insulin: Notify the staff so that you can be scheduled 1st case in the morning. If your diabetes requires high dose insulin, take only  of your normal insulin dose the morning of the procedure and notify the staff that you have done so. . Preventing infections: Shower with an antibacterial soap the morning of your procedure. . Build-up your immune system: Take 1000 mg of Vitamin C with every meal (3 times a day) the day prior to your procedure. . Antibiotics: Inform the staff if you have a condition or reason that requires you to take antibiotics before dental procedures. . Pregnancy: If you are pregnant, call and cancel the procedure. . Sickness: If you have a cold, fever, or any active infections, call and cancel the procedure. . Arrival: You must be in the facility at least 30 minutes prior to your scheduled procedure. . Children: Do not bring children with you. . Dress appropriately:  Bring dark clothing that you would not mind if they get stained. . Valuables: Do not bring any jewelry or valuables.  Reasons to call and reschedule or cancel your procedure: (Following these recommendations will minimize the risk of a serious complication.) . Surgeries: Avoid having procedures within 2 weeks of any surgery. (Avoid for 2 weeks before or after any surgery). . Flu Shots: Avoid having procedures within 2 weeks of a flu shots or . (Avoid for 2 weeks before or after immunizations). . Barium: Avoid having a procedure within 7-10 days after having had a radiological study involving the use of radiological contrast. (Myelograms, Barium swallow or enema study). . Heart attacks: Avoid any elective procedures or surgeries for the initial 6 months after a "Myocardial Infarction" (Heart Attack). . Blood thinners: It is imperative that you stop these medications before procedures. Let us know if you if you take any blood thinner.  . Infection: Avoid procedures during or within two weeks of an infection (including chest colds or gastrointestinal problems). Symptoms associated with infections include: Localized redness, fever, chills, night sweats or profuse sweating, burning sensation when voiding, cough, congestion, stuffiness, runny nose, sore throat, diarrhea, nausea, vomiting, cold or Flu symptoms, recent or current infections. It is specially important if the infection is over the area that we intend to treat. . Heart and lung problems: Symptoms that may suggest an active cardiopulmonary problem include: cough, chest pain, breathing difficulties or shortness of breath, dizziness, ankle swelling, uncontrolled high or unusually low blood pressure, and/or palpitations. If you are experiencing any of these symptoms, cancel your procedure and contact your primary care physician for an evaluation.  Remember:  Regular Business hours are:    Monday to Thursday 8:00 AM to 4:00 PM  Provider's  Schedule: Roman Sandall, MD:  Procedure days: Tuesday and Thursday 7:30 AM to 4:00 PM  Bilal Lateef, MD:  Procedure days: Monday and Wednesday 7:30 AM to 4:00 PM ____________________________________________________________________________________________   ____________________________________________________________________________________________  General Risks and Possible Complications  Patient Responsibilities: It is important that you read this as it is part of your informed consent. It is our duty to inform you of the risks and possible complications associated with treatments offered to you. It is your responsibility as a patient to read this and to ask questions about anything that is not clear or that you believe was not covered in this document.  Patient's Rights: You have the right to refuse treatment. You also have the right to change your mind, even after initially having agreed to have the treatment done. However, under this last option, if you wait until the last second to change your mind, you may be charged for the materials used up to that point.  Introduction: Medicine is not an exact science. Everything in Medicine, including the lack of treatment(s), carries the potential for danger, harm, or loss (which is by definition: Risk). In Medicine, a complication is a secondary problem, condition, or disease that can aggravate an already existing one. All treatments carry the risk of possible complications. The fact that a side effects or complications occurs, does not imply that the treatment was conducted incorrectly. It must be clearly understood that these can happen even when everything is done following the highest safety standards.  No treatment: You can choose not to proceed with the proposed treatment alternative. The "PRO(s)" would include: avoiding the risk of complications associated with the therapy. The "CON(s)" would include: not getting any of the treatment  benefits. These benefits fall under one of three categories: diagnostic; therapeutic; and/or palliative. Diagnostic benefits include: getting information which can ultimately lead to improvement of the disease or symptom(s). Therapeutic benefits are those associated with the successful treatment of the disease. Finally, palliative benefits are those related to the decrease of the primary symptoms, without necessarily curing the condition (example: decreasing the pain from a flare-up of a chronic condition, such as incurable terminal cancer).  General Risks and Complications: These are associated to most interventional treatments. They can occur alone, or in combination. They fall under one of the following six (6) categories: no benefit or worsening of symptoms; bleeding; infection; nerve damage; allergic reactions; and/or death. 1. No benefits or worsening of symptoms: In Medicine there are no guarantees, only probabilities. No healthcare provider can ever guarantee that a medical treatment will work, they can only state the probability that it may. Furthermore, there is always the possibility that the condition may worsen, either directly, or indirectly, as a consequence of the treatment. 2. Bleeding: This is more common if the patient is taking a blood thinner, either prescription or over the counter (example: Goody Powders, Fish oil, Aspirin, Garlic, etc.), or if suffering a condition associated with impaired coagulation (example: Hemophilia, cirrhosis of the liver, low platelet counts, etc.). However, even if you do not have one on these, it can still happen. If you have any of these conditions, or take one of these drugs, make sure to notify your treating physician. 3. Infection: This is more common in patients with a compromised immune system, either due to disease (example: diabetes, cancer, human immunodeficiency virus [HIV], etc.), or due to medications or treatments (example: therapies used to treat  cancer and   rheumatological diseases). However, even if you do not have one on these, it can still happen. If you have any of these conditions, or take one of these drugs, make sure to notify your treating physician. 4. Nerve Damage: This is more common when the treatment is an invasive one, but it can also happen with the use of medications, such as those used in the treatment of cancer. The damage can occur to small secondary nerves, or to large primary ones, such as those in the spinal cord and brain. This damage may be temporary or permanent and it may lead to impairments that can range from temporary numbness to permanent paralysis and/or brain death. 5. Allergic Reactions: Any time a substance or material comes in contact with our body, there is the possibility of an allergic reaction. These can range from a mild skin rash (contact dermatitis) to a severe systemic reaction (anaphylactic reaction), which can result in death. 6. Death: In general, any medical intervention can result in death, most of the time due to an unforeseen complication. ____________________________________________________________________________________________   

## 2019-11-30 NOTE — Progress Notes (Signed)
Safety precautions to be maintained throughout the outpatient stay will include: orient to surroundings, keep bed in low position, maintain call bell within reach at all times, provide assistance with transfer out of bed and ambulation.  

## 2019-12-19 ENCOUNTER — Ambulatory Visit (INDEPENDENT_AMBULATORY_CARE_PROVIDER_SITE_OTHER): Payer: Medicare Other | Admitting: Nurse Practitioner

## 2019-12-19 ENCOUNTER — Encounter: Payer: Self-pay | Admitting: Nurse Practitioner

## 2019-12-19 VITALS — BP 110/68 | HR 99 | Temp 98.1°F | Ht 60.0 in | Wt 125.0 lb

## 2019-12-19 DIAGNOSIS — D75839 Thrombocytosis, unspecified: Secondary | ICD-10-CM | POA: Diagnosis not present

## 2019-12-19 DIAGNOSIS — E039 Hypothyroidism, unspecified: Secondary | ICD-10-CM

## 2019-12-19 DIAGNOSIS — M5416 Radiculopathy, lumbar region: Secondary | ICD-10-CM

## 2019-12-19 DIAGNOSIS — N644 Mastodynia: Secondary | ICD-10-CM | POA: Diagnosis not present

## 2019-12-19 DIAGNOSIS — Z0000 Encounter for general adult medical examination without abnormal findings: Secondary | ICD-10-CM

## 2019-12-19 DIAGNOSIS — Z1211 Encounter for screening for malignant neoplasm of colon: Secondary | ICD-10-CM

## 2019-12-19 NOTE — Patient Instructions (Addendum)
Please call and schedule your 3D mammogram as discussed. [facility] [address] [place], [place]   Please get your records from primary care and let us know when you had the colonoscopy and what it showed. When did they tell you to repeat it?   You are overdue for a Pap test as well.  We can perform the Pap test here at the primary care office.  You would prefer seeing your gynecologist.  We recommend that you get this done as soon as you can.  Please get an eye exam when you are able as well.  Your dental visit every 6 months is recommended as well.  A tetanus vaccine is recommended every 10 years.  Can you check with your records to see when your last tetanus vaccine was given.  Continue with healthy lifestyle, diet and exercise. A  diet rich in lean protein, nonfat low-fat dairy, vegetables, fruits, complex carbohydrates with walking for  Exercise.  Follow-up office visit in 3 months.    Preventive Care 73-39 Years Old, Female Preventive care refers to visits with your health care provider and lifestyle choices that can promote health and wellness. This includes:  A yearly physical exam. This may also be called an annual well check.  Regular dental visits and eye exams.  Immunizations.  Screening for certain conditions.  Healthy lifestyle choices, such as eating a healthy diet, getting regular exercise, not using drugs or products that contain nicotine and tobacco, and limiting alcohol use. What can I expect for my preventive care visit? Physical exam Your health care provider will check your:  Height and weight. This may be used to calculate body mass index (BMI), which tells if you are at a healthy weight.  Heart rate and blood pressure.  Skin for abnormal spots. Counseling Your health care provider may ask you questions about your:  Alcohol, tobacco, and drug use.  Emotional well-being.  Home and relationship  well-being.  Sexual activity.  Eating habits.  Work and work Statistician.  Method of birth control.  Menstrual cycle.  Pregnancy history. What immunizations do I need?  Influenza (flu) vaccine  This is recommended every year. Tetanus, diphtheria, and pertussis (Tdap) vaccine  You may need a Td booster every 10 years. Varicella (chickenpox) vaccine  You may need this if you have not been vaccinated. Zoster (shingles) vaccine  You may need this after age 4. Measles, mumps, and rubella (MMR) vaccine  You may need at least one dose of MMR if you were born in 1957 or later. You may also need a second dose. Pneumococcal conjugate (PCV13) vaccine  You may need this if you have certain conditions and were not previously vaccinated. Pneumococcal polysaccharide (PPSV23) vaccine  You may need one or two doses if you smoke cigarettes or if you have certain conditions. Meningococcal conjugate (MenACWY) vaccine  You may need this if you have certain conditions. Hepatitis A vaccine  You may need this if you have certain conditions or if you travel or work in places where you may be exposed to hepatitis A. Hepatitis B vaccine  You may need this if you have certain conditions or if you travel or work in places where you may be exposed to hepatitis B. Haemophilus influenzae type b (Hib) vaccine  You may need this if you have certain conditions. Human papillomavirus (HPV) vaccine  If recommended by your health care provider, you may need three doses over 6 months. You may receive  vaccines as individual doses or as more than one vaccine together in one shot (combination vaccines). Talk with your health care provider about the risks and benefits of combination vaccines. What tests do I need? Blood tests  Lipid and cholesterol levels. These may be checked every 5 years, or more frequently if you are over 41 years old.  Hepatitis C test.  Hepatitis B test. Screening  Lung  cancer screening. You may have this screening every year starting at age 76 if you have a 30-pack-year history of smoking and currently smoke or have quit within the past 15 years.  Colorectal cancer screening. All adults should have this screening starting at age 65 and continuing until age 63. Your health care provider may recommend screening at age 26 if you are at increased risk. You will have tests every 1-10 years, depending on your results and the type of screening test.  Diabetes screening. This is done by checking your blood sugar (glucose) after you have not eaten for a while (fasting). You may have this done every 1-3 years.  Mammogram. This may be done every 1-2 years. Talk with your health care provider about when you should start having regular mammograms. This may depend on whether you have a family history of breast cancer.  BRCA-related cancer screening. This may be done if you have a family history of breast, ovarian, tubal, or peritoneal cancers.  Pelvic exam and Pap test. This may be done every 3 years starting at age 45. Starting at age 33, this may be done every 5 years if you have a Pap test in combination with an HPV test. Other tests  Sexually transmitted disease (STD) testing.  Bone density scan. This is done to screen for osteoporosis. You may have this scan if you are at high risk for osteoporosis. Follow these instructions at home: Eating and drinking  Eat a diet that includes fresh fruits and vegetables, whole grains, lean protein, and low-fat dairy.  Take vitamin and mineral supplements as recommended by your health care provider.  Do not drink alcohol if: ? Your health care provider tells you not to drink. ? You are pregnant, may be pregnant, or are planning to become pregnant.  If you drink alcohol: ? Limit how much you have to 0-1 drink a day. ? Be aware of how much alcohol is in your drink. In the [place] one drink equals one 12 oz bottle of beer (355  mL), one 5 oz glass of wine (148 mL), or one 1 oz glass of hard liquor (44 mL). Lifestyle  Take daily care of your teeth and gums.  Stay active. Exercise for at least 30 minutes on 5 or more days each week.  Do not use any products that contain nicotine or tobacco, such as cigarettes, e-cigarettes, and chewing tobacco. If you need help quitting, ask your health care provider.  If you are sexually active, practice safe sex. Use a condom or other form of birth control (contraception) in order to prevent pregnancy and STIs (sexually transmitted infections).  If told by your health care provider, take low-dose aspirin daily starting at age 87. What's next?  Visit your health care provider once a year for a well check visit.  Ask your health care provider how often you should have your eyes and teeth checked.  Stay up to date on all vaccines. This information is not intended to replace advice given to you by your health care provider. Make sure you discuss any  questions you have with your health care provider. Document Revised: 10/15/2017 Document Reviewed: 10/15/2017 Elsevier Patient Education  2020 [employer].

## 2019-12-19 NOTE — Progress Notes (Signed)
Established Patient Office Visit  Subjective:  Patient ID: Cynthia Mcpherson, female    DOB: 1961/01/20  Age: 59 y.o. MRN: [id number]  CC:  Chief Complaint  Patient presents with  . Follow-up    HPI Cynthia Mcpherson is a 59 yo who presents for a complete physical exam.  She has no specific concerns today.  She comes in today with an interpreter.  Patient speaks and reads English.  The interpreter helped with the detailed conversation.  With further questioning, she reports that she had a Covid vaccine in April in the left arm.  About 3 weeks after the vaccine, she noticed a dull pain in the left axilla that is still present.  She has not completed her mammogram.  She has noted no breast tenderness or lumps.  She does not do a breast exam.  She is followed by Dr. Janese Banks in hematology for thrombocytosis and iron deficiency anemia.  Studies have been obtained, and the impression is continued monitoring.  She is stable.  She is followed by Dr. Gabriel Carina in Endocrinology for hypothyroidism.  She is doing well with TSH at 88 mcg.  Plan is to retest her again in January.  PLTS- HIGH- take iron- monitoring  She is followed by Dr. Dossie Arbour and pain management for chronic pain syndrome, he is having a nerve ablation next week for left leg pain.  Patient is hopeful that this will resolve her left leg pain.    Patient presents today for complete physical.  Immunizations: Not sure about her past vaccines-She will check with her previous provider. Pfizer Covid completed in April 2021.  Diet: Healthy Exercise: No regular  Colonoscopy: Colon in 2017 in [place] and she will call her primary care and get [place] the report. Dexa: no  Pap Smear: Pap- test- 2018- and it was normal. She  has never had an abnormal test. She wants to get it done by GYN and will set this up.   Mammogram: needs done  Vision: Not UTD- advised Dental: UTD   Past Medical History:  Diagnosis Date  . GERD (gastroesophageal  reflux disease)   . Thrombocytosis   . Thyroid disease    hypothyroidism     Past Surgical History:  Procedure Laterality Date  . BACK SURGERY      Family History  Problem Relation Age of Onset  . Psoriasis Mother   . Multiple sclerosis Sister   . Stroke Sister   . Hypertension Brother     Social History   Socioeconomic History  . Marital status: Married    Spouse name: Not on file  . Number of children: Not on file  . Years of education: Not on file  . Highest education level: Not on file  Occupational History  . Not on file  Tobacco Use  . Smoking status: Never Smoker  . Smokeless tobacco: Never Used  Substance and Sexual Activity  . Alcohol use: Not Currently  . Drug use: Never  . Sexual activity: Yes  Other Topics Concern  . Not on file  Social History Narrative   Married lives with her husband, has 1 grown daughter.  Retired Marine scientist.  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    Social Determinants of Health   Financial Resource Strain:   . Difficulty of Paying Living Expenses: Not on file  Food Insecurity:   . Worried About Charity fundraiser in the Last Year: Not on file  . Ran Out of Food in the Last Year: Not  on file  Transportation Needs:   . Lack of Transportation (Medical): Not on file  . Lack of Transportation (Non-Medical): Not on file  Physical Activity:   . Days of Exercise per Week: Not on file  . Minutes of Exercise per Session: Not on file  Stress:   . Feeling of Stress : Not on file  Social Connections:   . Frequency of Communication with Friends and Family: Not on file  . Frequency of Social Gatherings with Friends and Family: Not on file  . Attends Religious Services: Not on file  . Active Member of Clubs or Organizations: Not on file  . Attends Archivist Meetings: Not on file  . Marital Status: Not on file  Intimate Partner Violence:   . Fear of Current or Ex-Partner: Not on file  . Emotionally Abused: Not on file  . Physically  Abused: Not on file  . Sexually Abused: Not on file    Outpatient Medications Prior to Visit  Medication Sig Dispense Refill  . acetaminophen (TYLENOL) 500 MG tablet Take 500 mg by mouth every 6 (six) hours as needed.    . cyclobenzaprine (FLEXERIL) 10 MG tablet Take 1 tablet (10 mg total) by mouth 3 (three) times daily as needed for muscle spasms. 270 tablet 0  . ferrous sulfate 325 (65 FE) MG tablet Take 325 mg by mouth daily with breakfast.    . Levothyroxine Sodium (TIROSINT) 88 MCG CAPS Take 88 mcg by mouth daily.    . Multiple Vitamins-Minerals (MULTIVITAMIN WITH MINERALS) tablet Take 1 tablet by mouth daily.    Marland Kitchen omeprazole (PRILOSEC) 20 MG capsule Take 20 mg by mouth daily as needed.     Marland Kitchen levothyroxine (SYNTHROID) 88 MCG tablet Take 88 mcg by mouth daily.     . cyclobenzaprine (FLEXERIL) 10 MG tablet Take 10 mg by mouth in the morning, at noon, and at bedtime. (Patient not taking: Reported on 12/19/2019)     No facility-administered medications prior to visit.    Allergies  Allergen Reactions  . Influenza Vaccines Hives and Rash  . Iodinated Diagnostic Agents     Rash and breathing problems  . Latex Hives and Rash  . Shellfish Allergy Hives and Rash  . Iodine Itching and Rash    Review of Systems  Constitutional: Negative.   HENT: Negative.   Eyes: Negative.   Respiratory: Negative.   Cardiovascular: Negative.   Gastrointestinal: Negative.   Endocrine: Negative.   Musculoskeletal: Negative.   Allergic/Immunologic: Negative.   Neurological: Negative.   Hematological: Positive for adenopathy.  Psychiatric/Behavioral: Negative.       Objective:    Physical Exam Vitals reviewed. Exam conducted with a chaperone present.  Constitutional:      Appearance: She is normal weight.  HENT:     Head: Normocephalic and atraumatic.     Right Ear: Tympanic membrane normal.     Left Ear: Tympanic membrane normal.  Eyes:     Conjunctiva/sclera: Conjunctivae normal.      Pupils: Pupils are equal, round, and reactive to light.  Cardiovascular:     Rate and Rhythm: Normal rate and regular rhythm.     Pulses: Normal pulses.     Heart sounds: Normal heart sounds.  Pulmonary:     Effort: Pulmonary effort is normal.     Breath sounds: Normal breath sounds.  Chest:     Breasts:        Right: Normal. No swelling, bleeding, inverted nipple, nipple discharge or  skin change.        Left: Mass and tenderness present. No swelling, bleeding, inverted nipple, nipple discharge or skin change.       Comments: Also, tender left breast red outline area-no masses. No axillary lymphadenopathy.  Abdominal:     Palpations: Abdomen is soft.     Tenderness: There is no abdominal tenderness.  Musculoskeletal:        General: Normal range of motion.     Cervical back: Normal range of motion and neck supple.  Lymphadenopathy:     Upper Body:     Right upper body: No supraclavicular or axillary adenopathy.     Left upper body: No supraclavicular or axillary adenopathy.  Skin:    General: Skin is warm and dry.  Neurological:     General: No focal deficit present.     Mental Status: She is alert and oriented to person, place, and time.  Psychiatric:        Mood and Affect: Mood normal.        Behavior: Behavior normal.        Judgment: Judgment normal.     BP 110/68 (BP Location: Left Arm, Patient Position: Sitting, Cuff Size: Normal)   Pulse 99   Temp 98.1 F (36.7 C) (Oral)   Ht 5' (1.524 m)   Wt 125 lb (56.7 kg)   SpO2 99%   BMI 24.41 kg/m  Wt Readings from Last 3 Encounters:  12/19/19 125 lb (56.7 kg)  11/30/19 125 lb (56.7 kg)  11/15/19 127 lb 1.6 oz (57.7 kg)   Pulse Readings from Last 3 Encounters:  12/19/19 99  11/30/19 (!) 104  11/15/19 93    BP Readings from Last 3 Encounters:  12/19/19 110/68  11/30/19 110/76  11/15/19 (!) 114/91    Lab Results  Component Value Date   CHOL 252 (H) 06/23/2019   HDL 35.70 (L) 06/23/2019   LDLDIRECT 149.0  06/23/2019   TRIG (H) 06/23/2019    429.0 Triglyceride is over 400; calculations on Lipids are invalid.   CHOLHDL 7 06/23/2019      Health Maintenance Due  Topic Date Due  . MAMMOGRAM  10/17/2019    There are no preventive care reminders to display for this patient.  Lab Results  Component Value Date   TSH 0.17 (L) 11/11/2018   Lab Results  Component Value Date   WBC 7.3 11/15/2019   HGB 14.3 11/15/2019   HCT 42.9 11/15/2019   MCV 83.8 11/15/2019   PLT 425 (H) 11/15/2019   Lab Results  Component Value Date   NA 140 11/15/2019   K 3.6 11/15/2019   CO2 29 11/15/2019   GLUCOSE 109 (H) 11/15/2019   BUN 13 11/15/2019   CREATININE 0.83 11/15/2019   BILITOT 0.4 11/15/2019   ALKPHOS 81 11/15/2019   AST 35 11/15/2019   ALT 42 11/15/2019   PROT 7.7 11/15/2019   ALBUMIN 4.5 11/15/2019   CALCIUM 9.5 11/15/2019   ANIONGAP 11 11/15/2019   GFR 85.27 06/23/2019   Lab Results  Component Value Date   CHOL 252 (H) 06/23/2019   Lab Results  Component Value Date   HDL 35.70 (L) 06/23/2019   No results found for: [facility] Lab Results  Component Value Date   TRIG (H) 06/23/2019    429.0 Triglyceride is over 400; calculations on Lipids are invalid.   Lab Results  Component Value Date   CHOLHDL 7 06/23/2019   No results found for: HGBA1C  Assessment & Plan:   Problem List Items Addressed This Visit      Endocrine   Acquired hypothyroidism     Hematopoietic and Hemostatic   Thrombocytosis     Other   Breast pain, left   Relevant Orders   MM DIAG BREAST TOMO BILATERAL   US BREAST BILATERAL   Colon cancer screening - Primary     Pt advised that she has abnormal left breast findings  and needs a diagnostic mammo and US done. She voiced understanding. I reviewed the phone number as did the interpreter.  She wanted her AVS in [place] as I offered to convert to [place].   Please call and schedule your 3D mammogram as discussed. [facility] [address] [place], [place]  Please get your records from primary care and let us know when you had the colonoscopy and what it showed. When did they tell you to repeat it?   You are overdue for a Pap test as well.  We can perform the Pap test here at the primary care office.  You would prefer seeing your gynecologist.  We recommend that you get this done as soon as you can.  Please get an eye exam when you are able as well.  Your dental visit every 6 months is recommended as well.  A tetanus vaccine is recommended every 10 years.  Can you check with your records to see when your last tetanus vaccine was given.  Continue with healthy lifestyle, diet and exercise. A  diet rich in lean protein, nonfat low-fat dairy, vegetables, fruits, complex carbohydrates with walking for  Exercise.   Follow-up: Return in about 3 months (around 03/20/2020).   This visit occurred during the SARS-CoV-2 public health emergency.  Safety protocols were in place, including screening questions prior to the visit, additional usage of staff PPE, and extensive cleaning of exam room while observing appropriate contact time as indicated for disinfecting solutions.  d Denice Paradise, NP

## 2019-12-26 NOTE — Patient Instructions (Signed)

## 2019-12-26 NOTE — Progress Notes (Signed)
PROVIDER NOTE: Information contained herein reflects review and annotations entered in association with encounter. Interpretation of such information and data should be left to medically-trained personnel. Information provided to patient can be located elsewhere in the medical record under "Patient Instructions". Document created using STT-dictation technology, any transcriptional errors that may result from process are unintentional.    Patient: Cynthia Mcpherson  Service Category: Procedure  Provider: Gaspar Cola, MD  DOB: 1960-10-15  DOS: 12/27/2019  Location: [facility]  MRN: [id number]  Setting: Ambulatory - outpatient  Referring Provider: Marval Regal, NP  Type: Established Patient  Specialty: Interventional Pain Management  PCP: Marval Regal, NP   Primary Reason for Visit: Interventional Pain Management Treatment. CC: Back Pain (left, lower)  Procedure:          Anesthesia, Analgesia, Anxiolysis:  Type: Thermal Lumbar Facet, Medial Branch Radiofrequency Ablation/Neurotomy  #1  Primary Purpose: Therapeutic Region: Posterolateral Lumbosacral Spine Level: L2, L3, L4, L5, & S1 Medial Branch Level(s). These levels will denervate the L3-4, L4-5, and the L5-S1 lumbar facet joints. Laterality: Left  Type: Moderate (Conscious) Sedation combined with Local Anesthesia Indication(s): Analgesia and Anxiety Route: Intravenous (IV) IV Access: Secured Sedation: Meaningful verbal contact was maintained at all times during the procedure  Local Anesthetic: Lidocaine 1-2%  Position: Prone   Indications: 1. Lumbar facet syndrome (Left)   2. Spondylosis without myelopathy or radiculopathy, lumbosacral region   3. Lumbar facet arthropathy   4. DDD (degenerative disc disease), lumbosacral   5. Chronic low back pain (Primary Area of Pain) (Left) w/o sciatica    Allergy to povidone-iodine topical antiseptic    History of allergy to radiographic contrast media     History of allergy to shellfish    Latex precautions, history of latex allergy    Cynthia Mcpherson has been dealing with the above chronic pain for longer than three months and has either failed to respond, was unable to tolerate, or simply did not get enough benefit from other more conservative therapies including, but not limited to: 1. Over-the-counter medications 2. Anti-inflammatory medications 3. Muscle relaxants 4. Membrane stabilizers 5. Opioids 6. Physical therapy and/or chiropractic manipulation 7. Modalities (Heat, ice, etc.) 8. Invasive techniques such as nerve blocks. Cynthia Mcpherson has attained more than 50% relief of the pain from a series of diagnostic injections conducted in separate occasions.  Pain Score: Pre-procedure: 9 /10 Post-procedure: 0-No pain/10  Pre-op Assessment:  Cynthia Mcpherson is a 59 y.o. (year old), female patient, seen today for interventional treatment. She  has a past surgical history that includes Back surgery. Cynthia Mcpherson has a current medication list which includes the following prescription(s): acetaminophen, cyclobenzaprine, ferrous sulfate, levothyroxine sodium, multivitamin with minerals, omeprazole, hydrocodone-acetaminophen, and [START ON 01/03/2020] hydrocodone-acetaminophen, and the following Facility-Administered Medications: fentanyl and midazolam. Her primarily concern today is the Back Pain (left, lower)  Initial Vital Signs:  Pulse/HCG Rate: (!) 103ECG Heart Rate: 82 Temp: 97.7 F (36.5 C) Resp: 16 BP: 124/86 SpO2: 100 %  BMI: Estimated body mass index is 23.63 kg/m as calculated from the following:   Height as of this encounter: 5' (1.524 m).   Weight as of this encounter: 121 lb (54.9 kg).  Risk Assessment: Allergies: Reviewed. She is allergic to influenza vaccines, iodinated diagnostic agents, latex, shellfish allergy, and iodine.  Allergy Precautions: None required Coagulopathies: Reviewed. None identified.  Blood-thinner therapy: None  at this time Active Infection(s): Reviewed. None identified. Cynthia Mcpherson is afebrile  Site Confirmation: Cynthia Mcpherson  was asked to confirm the procedure and laterality before marking the site Procedure checklist: Completed Consent: Before the procedure and under the influence of no sedative(s), amnesic(s), or anxiolytics, the patient was informed of the treatment options, risks and possible complications. To fulfill our ethical and legal obligations, as recommended by the American Medical Association's Code of Ethics, I have informed the patient of my clinical impression; the nature and purpose of the treatment or procedure; the risks, benefits, and possible complications of the intervention; the alternatives, including doing nothing; the risk(s) and benefit(s) of the alternative treatment(s) or procedure(s); and the risk(s) and benefit(s) of doing nothing. The patient was provided information about the general risks and possible complications associated with the procedure. These may include, but are not limited to: failure to achieve desired goals, infection, bleeding, organ or nerve damage, allergic reactions, paralysis, and death. In addition, the patient was informed of those risks and complications associated to Spine-related procedures, such as failure to decrease pain; infection (i.e.: Meningitis, epidural or intraspinal abscess); bleeding (i.e.: epidural hematoma, subarachnoid hemorrhage, or any other type of intraspinal or peri-dural bleeding); organ or nerve damage (i.e.: Any type of peripheral nerve, nerve root, or spinal cord injury) with subsequent damage to sensory, motor, and/or autonomic systems, resulting in permanent pain, numbness, and/or weakness of one or several areas of the body; allergic reactions; (i.e.: anaphylactic reaction); and/or death. Furthermore, the patient was informed of those risks and complications associated with the medications. These include, but are not limited to:  allergic reactions (i.e.: anaphylactic or anaphylactoid reaction(s)); adrenal axis suppression; blood sugar elevation that in diabetics may result in ketoacidosis or comma; water retention that in patients with history of congestive heart failure may result in shortness of breath, pulmonary edema, and decompensation with resultant heart failure; weight gain; swelling or edema; medication-induced neural toxicity; particulate matter embolism and blood vessel occlusion with resultant organ, and/or nervous system infarction; and/or aseptic necrosis of one or more joints. Finally, the patient was informed that Medicine is not an exact science; therefore, there is also the possibility of unforeseen or unpredictable risks and/or possible complications that may result in a catastrophic outcome. The patient indicated having understood very clearly. We have given the patient no guarantees and we have made no promises. Enough time was given to the patient to ask questions, all of which were answered to the patient's satisfaction. Cynthia Mcpherson has indicated that she wanted to continue with the procedure. Attestation: I, the ordering provider, attest that I have discussed with the patient the benefits, risks, side-effects, alternatives, likelihood of achieving goals, and potential problems during recovery for the procedure that I have provided informed consent. Date  Time: 12/27/2019  9:56 AM  Pre-Procedure Preparation:  Monitoring: As per clinic protocol. Respiration, ETCO2, SpO2, BP, heart rate and rhythm monitor placed and checked for adequate function Safety Precautions: Patient was assessed for positional comfort and pressure points before starting the procedure. Time-out: I initiated and conducted the "Time-out" before starting the procedure, as per protocol. The patient was asked to participate by confirming the accuracy of the "Time Out" information. Verification of the correct person, site, and procedure were  performed and confirmed by me, the nursing staff, and the patient. "Time-out" conducted as per Joint Commission's Universal Protocol (UP.01.01.01). Time: 1038  Description of Procedure:          Laterality: Left Levels:  L2, L3, L4, L5, & S1 Medial Branch Level(s), at the L3-4, L4-5, and the L5-S1 lumbar facet joints.  Area Prepped: Lumbosacral DuraPrep (Iodine Povacrylex [0.7% available iodine] and Isopropyl Alcohol, 74% w/w) Safety Precautions: Aspiration looking for blood return was conducted prior to all injections. At no point did we inject any substances, as a needle was being advanced. Before injecting, the patient was told to immediately notify me if she was experiencing any new onset of "ringing in the ears, or metallic taste in the mouth". No attempts were made at seeking any paresthesias. Safe injection practices and needle disposal techniques used. Medications properly checked for expiration dates. SDV (single dose vial) medications used. After the completion of the procedure, all disposable equipment used was discarded in the proper designated medical waste containers. Local Anesthesia: Protocol guidelines were followed. The patient was positioned over the fluoroscopy table. The area was prepped in the usual manner. The time-out was completed. The target area was identified using fluoroscopy. A 12-in long, straight, sterile hemostat was used with fluoroscopic guidance to locate the targets for each level blocked. Once located, the skin was marked with an approved surgical skin marker. Once all sites were marked, the skin (epidermis, dermis, and hypodermis), as well as deeper tissues (fat, connective tissue and muscle) were infiltrated with a small amount of a short-acting local anesthetic, loaded on a 10cc syringe with a 25G, 1.5-in  Needle. An appropriate amount of time was allowed for local anesthetics to take effect before proceeding to the next step. Local Anesthetic: Lidocaine 2.0% The  unused portion of the local anesthetic was discarded in the proper designated containers. Technical explanation of process:  Radiofrequency Ablation (RFA) L2 Medial Branch Nerve RFA: The target area for the L2 medial branch is at the junction of the postero-lateral aspect of the superior articular process and the superior, posterior, and medial edge of the transverse process of L3. Under fluoroscopic guidance, a Radiofrequency needle was inserted until contact was made with os over the superior postero-lateral aspect of the pedicular shadow (target area). Sensory and motor testing was conducted to properly adjust the position of the needle. Once satisfactory placement of the needle was achieved, the numbing solution was slowly injected after negative aspiration for blood. 2.0 mL of the nerve block solution was injected without difficulty or complication. After waiting for at least 3 minutes, the ablation was performed. Once completed, the needle was removed intact. L3 Medial Branch Nerve RFA: The target area for the L3 medial branch is at the junction of the postero-lateral aspect of the superior articular process and the superior, posterior, and medial edge of the transverse process of L4. Under fluoroscopic guidance, a Radiofrequency needle was inserted until contact was made with os over the superior postero-lateral aspect of the pedicular shadow (target area). Sensory and motor testing was conducted to properly adjust the position of the needle. Once satisfactory placement of the needle was achieved, the numbing solution was slowly injected after negative aspiration for blood. 2.0 mL of the nerve block solution was injected without difficulty or complication. After waiting for at least 3 minutes, the ablation was performed. Once completed, the needle was removed intact. L4 Medial Branch Nerve RFA: The target area for the L4 medial branch is at the junction of the postero-lateral aspect of the superior  articular process and the superior, posterior, and medial edge of the transverse process of L5. Under fluoroscopic guidance, a Radiofrequency needle was inserted until contact was made with os over the superior postero-lateral aspect of the pedicular shadow (target area). Sensory and motor testing was conducted to properly adjust the  position of the needle. Once satisfactory placement of the needle was achieved, the numbing solution was slowly injected after negative aspiration for blood. 2.0 mL of the nerve block solution was injected without difficulty or complication. After waiting for at least 3 minutes, the ablation was performed. Once completed, the needle was removed intact. L5 Medial Branch Nerve RFA: The target area for the L5 medial branch is at the junction of the postero-lateral aspect of the superior articular process of S1 and the superior, posterior, and medial edge of the sacral ala. Under fluoroscopic guidance, a Radiofrequency needle was inserted until contact was made with os over the superior postero-lateral aspect of the pedicular shadow (target area). Sensory and motor testing was conducted to properly adjust the position of the needle. Once satisfactory placement of the needle was achieved, the numbing solution was slowly injected after negative aspiration for blood. 2.0 mL of the nerve block solution was injected without difficulty or complication. After waiting for at least 3 minutes, the ablation was performed. Once completed, the needle was removed intact. S1 Medial Branch Nerve RFA: The target area for the S1 medial branch is located inferior to the junction of the S1 superior articular process and the L5 inferior articular process, posterior, inferior, and lateral to the 6 o'clock position of the L5-S1 facet joint, just superior to the S1 posterior foramen. Under fluoroscopic guidance, the Radiofrequency needle was advanced until contact was made with os over the Target area. Sensory  and motor testing was conducted to properly adjust the position of the needle. Once satisfactory placement of the needle was achieved, the numbing solution was slowly injected after negative aspiration for blood. 2.0 mL of the nerve block solution was injected without difficulty or complication. After waiting for at least 3 minutes, the ablation was performed. Once completed, the needle was removed intact. Radiofrequency lesioning (ablation):  Radiofrequency Generator: NeuroTherm [id number] Sensory Stimulation Parameters: 50 Hz was used to locate & identify the nerve, making sure that the needle was positioned such that there was no sensory stimulation below 0.3 V or above 0.7 V. Motor Stimulation Parameters: 2 Hz was used to evaluate the motor component. Care was taken not to lesion any nerves that demonstrated motor stimulation of the lower extremities at an output of less than 2.5 times that of the sensory threshold, or a maximum of 2.0 V. Lesioning Technique Parameters: Standard Radiofrequency settings. (Not bipolar or pulsed.) Temperature Settings: 80 degrees C Lesioning time: 60 seconds Intra-operative Compliance: Compliant Materials & Medications: Needle(s) (Electrode/Cannula) Type: Teflon-coated, curved tip, Radiofrequency needle(s) Gauge: 22G Length: 10cm Numbing solution: 0.2% PF-Ropivacaine + Triamcinolone (40 mg/mL) diluted to a final concentration of 4 mg of Triamcinolone/mL of Ropivacaine The unused portion of the solution was discarded in the proper designated containers.  Once the entire procedure was completed, the treated area was cleaned, making sure to leave some of the prepping solution back to take advantage of its long term bactericidal properties.  Illustration of the posterior view of the lumbar spine and the posterior neural structures. Laminae of L2 through S1 are labeled. DPRL5, dorsal primary ramus of L5; DPRS1, dorsal primary ramus of S1; DPR3, dorsal primary ramus of L3;  FJ, facet (zygapophyseal) joint L3-L4; I, inferior articular process of L4; LB1, lateral branch of dorsal primary ramus of L1; IAB, inferior articular branches from L3 medial branch (supplies L4-L5 facet joint); IBP, intermediate branch plexus; MB3, medial branch of dorsal primary ramus of L3; NR3, third lumbar nerve root; S, superior  articular process of L5; SAB, superior articular branches from L4 (supplies L4-5 facet joint also); TP3, transverse process of L3.  Vitals:   12/27/19 1104 12/27/19 1114 12/27/19 1124 12/27/19 1134  BP: 108/71 108/73 122/85 112/75  Pulse:      Resp: 16 (!) 9 19 20   Temp:  97.8 F (36.6 C)  (!) 97.4 F (36.3 C)  TempSrc:      SpO2: 100% 100% 100% 100%  Weight:      Height:       Start Time: 1038 hrs. End Time: 1104 hrs.  Imaging Guidance (Spinal):          Type of Imaging Technique: Fluoroscopy Guidance (Spinal) Indication(s): Assistance in needle guidance and placement for procedures requiring needle placement in or near specific anatomical locations not easily accessible without such assistance. Exposure Time: Please see nurses notes. Contrast: None used. Fluoroscopic Guidance: I was personally present during the use of fluoroscopy. "Tunnel Vision Technique" used to obtain the best possible view of the target area. Parallax error corrected before commencing the procedure. "Direction-depth-direction" technique used to introduce the needle under continuous pulsed fluoroscopy. Once target was reached, antero-posterior, oblique, and lateral fluoroscopic projection used confirm needle placement in all planes. Images permanently stored in EMR. Interpretation: No contrast injected. I personally interpreted the imaging intraoperatively. Adequate needle placement confirmed in multiple planes. Permanent images saved into the patient's record.  Antibiotic Prophylaxis:   Anti-infectives (From admission, onward)   None     Indication(s): None  identified  Post-operative Assessment:  Post-procedure Vital Signs:  Pulse/HCG Rate: (!) 10384 Temp: (!) 97.4 F (36.3 C) Resp: 20 BP: 112/75 SpO2: 100 %  EBL: None  Complications: No immediate post-treatment complications observed by team, or reported by patient.  Note: The patient tolerated the entire procedure well. A repeat set of vitals were taken after the procedure and the patient was kept under observation following institutional policy, for this type of procedure. Post-procedural neurological assessment was performed, showing return to baseline, prior to discharge. The patient was provided with post-procedure discharge instructions, including a section on how to identify potential problems. Should any problems arise concerning this procedure, the patient was given instructions to immediately contact us, at any time, without hesitation. In any case, we plan to contact the patient by telephone for a follow-up status report regarding this interventional procedure.  Comments:  No additional relevant information.  Plan of Care  Orders:  Orders Placed This Encounter  Procedures  . Radiofrequency,Lumbar    Scheduling Instructions:     Side(s): Left-sided     Level: L3-4, L4-5, & L5-S1 Facets (L2, L3, L4, L5, & S1 Medial Branch Nerves)     Sedation: Patient's choice.     Timeframe: Today    Order Specific Question:   Where will this procedure be performed?    Answer:   [facility]  . [facility] C-ARM 1-60 MIN NO REPORT    Intraoperative interpretation by procedural physician at [place].    Standing Status:   Standing    Number of Occurrences:   1    Order Specific Question:   Reason for exam:    Answer:   Assistance in needle guidance and placement for procedures requiring needle placement in or near specific anatomical locations not easily accessible without such assistance.  . Informed Consent Details: Physician/Practitioner Attestation; Transcribe  to consent form and obtain patient signature    Nursing Order: Transcribe to consent form and obtain  patient signature. Note: Always confirm laterality of pain with Ms. Torres, before procedure.    Order Specific Question:   Physician/Practitioner attestation of informed consent for procedure/surgical case    Answer:   I, the physician/practitioner, attest that I have discussed with the patient the benefits, risks, side effects, alternatives, likelihood of achieving goals and potential problems during recovery for the procedure that I have provided informed consent.    Order Specific Question:   Procedure    Answer:   Lumbar Facet Radiofrequency Ablation    Order Specific Question:   Physician/Practitioner performing the procedure    Answer:   Cyndel Griffey A. Dossie Arbour, MD    Order Specific Question:   Indication/Reason    Answer:   Low Back Pain, with our without leg pain, due to Facet Joint Arthralgia (Joint Pain) known as Lumbar Facet Syndrome, secondary to Lumbar, and/or Lumbosacral Spondylosis (Arthritis of the Spine), without myelopathy or radiculopathy (Nerve Damage).  . Provide equipment / supplies at bedside    "Radiofrequency Tray"; Large hemostat (x1); Small hemostat (x1); Towels (x8); 4x4 sterile sponge pack (x1) Needle type: Teflon-coated Radiofrequency Needle (Disposable  single use) Size: Regular Quantity: 5    Standing Status:   Standing    Number of Occurrences:   1    Order Specific Question:   Specify    Answer:   Radiofrequency Tray  . Miscellanous precautions    Standing Status:   Standing    Number of Occurrences:   1  . Miscellanous precautions    NOTE: Although It is true that patients can have allergies to shellfish and that shellfish contain iodine, most shellfish  allergies are due to two protein allergens present in the shellfish: tropomyosins and parvalbumin. Not all patients with shellfish allergies are allergic to iodine. However, as a precaution, avoid using iodine  containing products.    Standing Status:   Standing    Number of Occurrences:   1  . Latex precautions    Activate Latex-Free Protocol.    Standing Status:   Standing    Number of Occurrences:   1   Chronic Opioid Analgesic:  No opioid analgesics prescribed by our practice.   Medications ordered for procedure: Meds ordered this encounter  Medications  . lidocaine (XYLOCAINE) 2 % (with pres) injection 400 mg  . lactated ringers infusion 1,000 mL  . midazolam (VERSED) 5 MG/5ML injection 1-2 mg    Make sure Flumazenil is available in the pyxis when using this medication. If oversedation occurs, administer 0.2 mg IV over 15 sec. If after 45 sec no response, administer 0.2 mg again over 1 min; may repeat at 1 min intervals; not to exceed 4 doses (1 mg)  . fentaNYL (SUBLIMAZE) injection 25-50 mcg    Make sure Narcan is available in the pyxis when using this medication. In the event of respiratory depression (RR< 8/min): Titrate NARCAN (naloxone) in increments of 0.1 to 0.2 mg IV at 2-3 minute intervals, until desired degree of reversal.  . ropivacaine (PF) 2 mg/mL (0.2%) (NAROPIN) injection 9 mL  . triamcinolone acetonide (KENALOG-40) injection 40 mg  . HYDROcodone-acetaminophen (NORCO/VICODIN) 5-325 MG tablet    Sig: Take 1 tablet by mouth every 6 (six) hours as needed for up to 7 days for severe pain. Must last 7 days.    Dispense:  28 tablet    Refill:  0    For acute post-operative pain. Not to be refilled. Must last 7 days.  Marland Kitchen HYDROcodone-acetaminophen (NORCO/VICODIN) 5-325  MG tablet    Sig: Take 1 tablet by mouth every 6 (six) hours as needed for up to 7 days for severe pain. Must last 7 days.    Dispense:  28 tablet    Refill:  0    For acute post-operative pain. Not to be refilled.  Must last 7 days.   Medications administered: We administered lidocaine, lactated ringers, midazolam, fentaNYL, ropivacaine (PF) 2 mg/mL (0.2%), and triamcinolone acetonide.  See the medical record  for exact dosing, route, and time of administration.  Follow-up plan:   Return in about 6 weeks (around 02/07/2020) for (F2F), (PP) Follow-up.       Interventional management options: Procedure(s) under consideration:  Diagnostic left-sided lumbar facet block #3 under fluoroscopic guidance and IV sedation  Possible left-sided lumbar facet RFA       Recent Visits Date Type Provider Dept  11/30/19 Office Visit Milinda Pointer, MD [facility]  Showing recent visits within past 90 days and meeting all other requirements Today's Visits Date Type Provider Dept  12/27/19 Procedure visit Milinda Pointer, MD [facility]  Showing today's visits and meeting all other requirements Future Appointments Date Type Provider Dept  01/30/20 Appointment Milinda Pointer, MD [facility] [facility]  Showing future appointments within next 90 days and meeting all other requirements  Disposition: Discharge home  Discharge (Date  Time): 12/27/2019; 1142 hrs.   Primary Care Physician: Marval Regal, NP Location: [facility] Note by: Gaspar Cola, MD Date: 12/27/2019; Time: 12:38 PM  Disclaimer:  Medicine is not an Chief Strategy Officer. The only guarantee in medicine is that nothing is guaranteed. It is important to note that the decision to proceed with this intervention was based on the information collected from the patient. The Data and conclusions were drawn from the patient's questionnaire, the interview, and the physical examination. Because the information was provided in large part by the patient, it cannot be guaranteed that it has not been purposely or unconsciously manipulated. Every effort has been made to obtain as much relevant data as possible for this evaluation. It is important to note that the conclusions that lead to this procedure are derived in large part from the available data. Always take into account that the treatment will  also be dependent on availability of resources and existing treatment guidelines, considered by other Pain Management Practitioners as being common knowledge and practice, at the time of the intervention. For Medico-Legal purposes, it is also important to point out that variation in procedural techniques and pharmacological choices are the acceptable norm. The indications, contraindications, technique, and results of the above procedure should only be interpreted and judged by a Board-Certified Interventional Pain Specialist with extensive familiarity and expertise in the same exact procedure and technique.

## 2019-12-27 ENCOUNTER — Other Ambulatory Visit: Payer: Self-pay

## 2019-12-27 ENCOUNTER — Ambulatory Visit
Admission: RE | Admit: 2019-12-27 | Discharge: 2019-12-27 | Disposition: A | Discharge status: Home / Self Care | Payer: Medicare Other | Source: Ambulatory Visit | Attending: Pain Medicine | Admitting: Pain Medicine

## 2019-12-27 ENCOUNTER — Encounter: Payer: Self-pay | Admitting: Pain Medicine

## 2019-12-27 ENCOUNTER — Ambulatory Visit (HOSPITAL_BASED_OUTPATIENT_CLINIC_OR_DEPARTMENT_OTHER): Payer: Medicare Other | Admitting: Pain Medicine

## 2019-12-27 VITALS — BP 112/75 | HR 103 | Temp 97.4°F | Resp 20 | Ht 60.0 in | Wt 121.0 lb

## 2019-12-27 DIAGNOSIS — M545 Low back pain, unspecified: Secondary | ICD-10-CM | POA: Diagnosis not present

## 2019-12-27 DIAGNOSIS — M47816 Spondylosis without myelopathy or radiculopathy, lumbar region: Secondary | ICD-10-CM

## 2019-12-27 DIAGNOSIS — Z91041 Radiographic dye allergy status: Secondary | ICD-10-CM | POA: Diagnosis present

## 2019-12-27 DIAGNOSIS — Z91013 Allergy to seafood: Secondary | ICD-10-CM | POA: Diagnosis present

## 2019-12-27 DIAGNOSIS — Z883 Allergy status to other anti-infective agents status: Secondary | ICD-10-CM

## 2019-12-27 DIAGNOSIS — Z91040 Latex allergy status: Secondary | ICD-10-CM | POA: Diagnosis present

## 2019-12-27 DIAGNOSIS — M5137 Other intervertebral disc degeneration, lumbosacral region: Secondary | ICD-10-CM | POA: Insufficient documentation

## 2019-12-27 DIAGNOSIS — M47817 Spondylosis without myelopathy or radiculopathy, lumbosacral region: Secondary | ICD-10-CM

## 2019-12-27 DIAGNOSIS — G8929 Other chronic pain: Secondary | ICD-10-CM | POA: Insufficient documentation

## 2019-12-27 DIAGNOSIS — G8918 Other acute postprocedural pain: Secondary | ICD-10-CM

## 2019-12-27 MED ORDER — MIDAZOLAM HCL 5 MG/5ML IJ SOLN
1.0000 mg | INTRAMUSCULAR | Status: DC | PRN
  Administered 2019-12-27: 1.5 mg via INTRAVENOUS
  Filled 2019-12-27: qty 5

## 2019-12-27 MED ORDER — LIDOCAINE HCL 2 % IJ SOLN
20.0000 mL | Freq: Once | INTRAMUSCULAR | Status: AC
  Administered 2019-12-27: 400 mg
  Filled 2019-12-27: qty 40

## 2019-12-27 MED ORDER — HYDROCODONE-ACETAMINOPHEN 5-325 MG PO TABS: 1.0000 | ORAL_TABLET | Freq: Four times a day (QID) | ORAL | 0 refills | Status: DC | PRN

## 2019-12-27 MED ORDER — TRIAMCINOLONE ACETONIDE 40 MG/ML IJ SUSP
40.0000 mg | Freq: Once | INTRAMUSCULAR | Status: AC
  Administered 2019-12-27: 40 mg
  Filled 2019-12-27: qty 1

## 2019-12-27 MED ORDER — FENTANYL CITRATE (PF) 100 MCG/2ML IJ SOLN
25.0000 ug | INTRAMUSCULAR | Status: DC | PRN
  Administered 2019-12-27: 75 ug via INTRAVENOUS
  Filled 2019-12-27: qty 2

## 2019-12-27 MED ORDER — ROPIVACAINE HCL 2 MG/ML IJ SOLN
9.0000 mL | Freq: Once | INTRAMUSCULAR | Status: AC
  Administered 2019-12-27: 9 mL via PERINEURAL
  Filled 2019-12-27: qty 10

## 2019-12-27 MED ORDER — HYDROCODONE-ACETAMINOPHEN 5-325 MG PO TABS: 1.0000 | ORAL_TABLET | Freq: Four times a day (QID) | ORAL | 0 refills | Status: AC | PRN

## 2019-12-27 MED ORDER — LACTATED RINGERS IV SOLN
1000.0000 mL | Freq: Once | INTRAVENOUS | Status: AC
  Administered 2019-12-27: 1000 mL via INTRAVENOUS

## 2019-12-27 NOTE — Progress Notes (Signed)
Safety precautions to be maintained throughout the outpatient stay will include: orient to surroundings, keep bed in low position, maintain call bell within reach at all times, provide assistance with transfer out of bed and ambulation.  

## 2019-12-28 ENCOUNTER — Telehealth: Payer: Self-pay

## 2019-12-28 NOTE — Telephone Encounter (Signed)
Post procedure follow up.  Patient states she is doing well.   ?

## 2020-01-02 ENCOUNTER — Other Ambulatory Visit: Payer: Self-pay

## 2020-01-02 ENCOUNTER — Ambulatory Visit (INDEPENDENT_AMBULATORY_CARE_PROVIDER_SITE_OTHER): Payer: Medicare Other | Admitting: Nurse Practitioner

## 2020-01-02 ENCOUNTER — Telehealth: Payer: Self-pay | Admitting: Nurse Practitioner

## 2020-01-02 ENCOUNTER — Encounter: Payer: Self-pay | Admitting: Nurse Practitioner

## 2020-01-02 VITALS — BP 112/70 | HR 86 | Temp 97.8°F | Ht 60.0 in | Wt 123.0 lb

## 2020-01-02 DIAGNOSIS — R7989 Other specified abnormal findings of blood chemistry: Secondary | ICD-10-CM

## 2020-01-02 DIAGNOSIS — M546 Pain in thoracic spine: Secondary | ICD-10-CM

## 2020-01-02 DIAGNOSIS — E039 Hypothyroidism, unspecified: Secondary | ICD-10-CM

## 2020-01-02 DIAGNOSIS — R300 Dysuria: Secondary | ICD-10-CM

## 2020-01-02 DIAGNOSIS — R7401 Elevation of levels of liver transaminase levels: Secondary | ICD-10-CM

## 2020-01-02 DIAGNOSIS — R1011 Right upper quadrant pain: Secondary | ICD-10-CM

## 2020-01-02 LAB — POCT URINALYSIS DIPSTICK
Bilirubin, UA: NEGATIVE
Glucose, UA: NEGATIVE
Ketones, UA: NEGATIVE
Leukocytes, UA: NEGATIVE
Nitrite, UA: NEGATIVE
Protein, UA: NEGATIVE
Spec Grav, UA: 1.01 (ref 1.010–1.025)
Urobilinogen, UA: 0.2 E.U./dL
pH, UA: 6 (ref 5.0–8.0)

## 2020-01-02 NOTE — Telephone Encounter (Signed)
Please call her and let her know her insurance is not covering the Vit D level. I did not order it.

## 2020-01-02 NOTE — Progress Notes (Signed)
 Established Patient Office Visit  Subjective:  Patient ID: Cynthia Mcpherson, female    DOB: 09/01/1960  Age: 59 y.o. MRN: [id number]  CC:  Chief Complaint  Patient presents with  . Acute Visit    back pain    HPI Cynthia Mcpherson is a 59 yo who presents alone and we used a telephone interpret service to communicate. She understands and speaks some English, but is more comfortable speaking in Spanish when giving more complex information.   She reports back pain that has been present since Friday.  She saw pain management on Tuesday and had a radiofrequency ablation in her lumbar back.  She is experiencing pain in the upper back under the bilateral ribs and lateral side area.  No trauma or injury.  She describes this as pulsating or just more of a constant ache.  She has a discomfort when she twists from side to side and moves.  She has no pain going down her leg, or into the buttock. She has not called her pain management team about this because her discomfort is in a different place from when from where she had her procedure.   She also feels a need to urinate.  She has had some frequency, little pelvic pressure onset yesterday.  She thinks she may have a UTI.  No burning, hematuria, or vaginal symptoms.  She had a slight nausea once Friday morning resolved.  No flank pain.  No fevers or chills.  Past Medical History:  Diagnosis Date  . GERD (gastroesophageal reflux disease)   . Thrombocytosis   . Thyroid disease    hypothyroidism     Past Surgical History:  Procedure Laterality Date  . BACK SURGERY      Family History  Problem Relation Age of Onset  . Psoriasis Mother   . Multiple sclerosis Sister   . Stroke Sister   . Hypertension Brother     Social History   Socioeconomic History  . Marital status: Married    Spouse name: Not on file  . Number of children: Not on file  . Years of education: Not on file  . Highest education level: Not on file  Occupational  History  . Not on file  Tobacco Use  . Smoking status: Never Smoker  . Smokeless tobacco: Never Used  Substance and Sexual Activity  . Alcohol use: Not Currently  . Drug use: Never  . Sexual activity: Yes  Other Topics Concern  . Not on file  Social History Narrative   Married lives with her husband, has 1 grown daughter.  Retired Nurse.  Daughter lives in [place].    Social Determinants of Health   Financial Resource Strain:   . Difficulty of Paying Living Expenses: Not on file  Food Insecurity:   . Worried About Running Out of Food in the Last Year: Not on file  . Ran Out of Food in the Last Year: Not on file  Transportation Needs:   . Lack of Transportation (Medical): Not on file  . Lack of Transportation (Non-Medical): Not on file  Physical Activity:   . Days of Exercise per Week: Not on file  . Minutes of Exercise per Session: Not on file  Stress:   . Feeling of Stress : Not on file  Social Connections:   . Frequency of Communication with Friends and Family: Not on file  . Frequency of Social Gatherings with Friends and Family: Not on file  . Attends Religious Services: Not on   file  . Active Member of Clubs or Organizations: Not on file  . Attends Club or Organization Meetings: Not on file  . Marital Status: Not on file  Intimate Partner Violence:   . Fear of Current or Ex-Partner: Not on file  . Emotionally Abused: Not on file  . Physically Abused: Not on file  . Sexually Abused: Not on file    Outpatient Medications Prior to Visit  Medication Sig Dispense Refill  . acetaminophen (TYLENOL) 500 MG tablet Take 500 mg by mouth every 6 (six) hours as needed.    . cyclobenzaprine (FLEXERIL) 10 MG tablet Take 1 tablet (10 mg total) by mouth 3 (three) times daily as needed for muscle spasms. 270 tablet 0  . ferrous sulfate 325 (65 FE) MG tablet Take 325 mg by mouth daily with breakfast.    . Levothyroxine Sodium (TIROSINT) 88 MCG CAPS Take 88 mcg by mouth daily.      . Multiple Vitamins-Minerals (MULTIVITAMIN WITH MINERALS) tablet Take 1 tablet by mouth daily.    . omeprazole (PRILOSEC) 20 MG capsule Take 20 mg by mouth daily as needed.     . HYDROcodone-acetaminophen (NORCO/VICODIN) 5-325 MG tablet Take 1 tablet by mouth every 6 (six) hours as needed for up to 7 days for severe pain. Must last 7 days. (Patient not taking: Reported on 01/02/2020) 28 tablet 0  . [START ON 01/03/2020] HYDROcodone-acetaminophen (NORCO/VICODIN) 5-325 MG tablet Take 1 tablet by mouth every 6 (six) hours as needed for up to 7 days for severe pain. Must last 7 days. 28 tablet 0   No facility-administered medications prior to visit.    Allergies  Allergen Reactions  . Influenza Vaccines Hives and Rash  . Iodinated Diagnostic Agents     Rash and breathing problems  . Latex Hives and Rash  . Shellfish Allergy Hives and Rash  . Iodine Itching and Rash    Review of Systems Pertinent positives noted in history of present illness and otherwise negative.   Objective:    Physical Exam Vitals reviewed.  Constitutional:      Appearance: She is normal weight.  Cardiovascular:     Rate and Rhythm: Normal rate and regular rhythm.     Pulses: Normal pulses.     Heart sounds: Normal heart sounds.  Pulmonary:     Effort: Pulmonary effort is normal.     Breath sounds: Normal breath sounds.  Abdominal:     Palpations: Abdomen is soft.     Tenderness: There is no abdominal tenderness. There is no right CVA tenderness, left CVA tenderness or guarding.  Musculoskeletal:     Cervical back: Normal, normal range of motion and neck supple.     Thoracic back: No deformity, spasms or tenderness.     Lumbar back: Normal. No deformity or tenderness. Negative right straight leg raise test and negative left straight leg raise test.     Comments: Thoracic spine- normal ROM and mild tender over para spinous . Also, area of discomfort is bilat under ribs and lateral side. Twisting  side to side  cause discomfort.   Skin:    General: Skin is warm and dry.  Neurological:     General: No focal deficit present.     Mental Status: She is alert and oriented to person, place, and time.  Psychiatric:        Mood and Affect: Mood normal.        Behavior: Behavior normal.     BP 112/70 (  BP Location: Left Arm, Patient Position: Sitting, Cuff Size: Normal)   Pulse 86   Temp 97.8 F (36.6 C) (Oral)   Ht 5' (1.524 m)   Wt 123 lb (55.8 kg)   SpO2 99%   BMI 24.02 kg/m  Wt Readings from Last 3 Encounters:  01/02/20 123 lb (55.8 kg)  12/27/19 121 lb (54.9 kg)  12/19/19 125 lb (56.7 kg)   Pulse Readings from Last 3 Encounters:  01/02/20 86  12/27/19 (!) 103  12/19/19 99    BP Readings from Last 3 Encounters:  01/02/20 112/70  12/27/19 112/75  12/19/19 110/68    Lab Results  Component Value Date   CHOL 252 (H) 06/23/2019   HDL 35.70 (L) 06/23/2019   LDLDIRECT 149.0 06/23/2019   TRIG (H) 06/23/2019    429.0 Triglyceride is over 400; calculations on Lipids are invalid.   CHOLHDL 7 06/23/2019      Health Maintenance Due  Topic Date Due  . MAMMOGRAM  10/17/2019    There are no preventive care reminders to display for this patient.  Lab Results  Component Value Date   TSH 0.17 (L) 11/11/2018   Lab Results  Component Value Date   WBC 7.3 11/15/2019   HGB 14.3 11/15/2019   HCT 42.9 11/15/2019   MCV 83.8 11/15/2019   PLT 425 (H) 11/15/2019   Lab Results  Component Value Date   NA 140 11/15/2019   K 3.6 11/15/2019   CO2 29 11/15/2019   GLUCOSE 109 (H) 11/15/2019   BUN 13 11/15/2019   CREATININE 0.83 11/15/2019   BILITOT 0.4 11/15/2019   ALKPHOS 81 11/15/2019   AST 35 11/15/2019   ALT 42 11/15/2019   PROT 7.7 11/15/2019   ALBUMIN 4.5 11/15/2019   CALCIUM 9.5 11/15/2019   ANIONGAP 11 11/15/2019   GFR 85.27 06/23/2019   Lab Results  Component Value Date   CHOL 252 (H) 06/23/2019   Lab Results  Component Value Date   HDL 35.70 (L) 06/23/2019   No  results found for: LDLCALC Lab Results  Component Value Date   TRIG (H) 06/23/2019    429.0 Triglyceride is over 400; calculations on Lipids are invalid.   Lab Results  Component Value Date   CHOLHDL 7 06/23/2019   No results found for: HGBA1C    Assessment & Plan:   Problem List Items Addressed This Visit      Other   Dysuria - Primary   Relevant Orders   Urinalysis, Routine w reflex microscopic   POCT UA - Glucose/Protein   Urine Culture   CBC With Differential   Comp Met (CMET)   Acute bilateral thoracic back pain   Relevant Orders   CBC With Differential   Comp Met (CMET)     Cynthia Mcpherson has chronic back pain mostly in the lower, now she is having it in thoracic and is bilateral - although more prominent on right. No abdominal pain or tenderness on exam.Lungs are CTA and no cough.  She points to lateral sides and lower ribs. Also, with tenderness thoracic paraspinous bilateral.   Close questioning regarding flank pain and no pain to percussion.  She can treat back pain in her usual fashion, she uses a cool pad, takes chronic medications to include Flexeril 10 mg 3 times a day as needed.  She has not been taking her Flexeril and was encouraged to use that.  She has taken Tylenol, has not utilized her hydrocodone that was given after her back   ablation procedure.  If Tylenol and Flexeril does not help her thoracic pain, she may take the hydrocodone as directed.  Continue to follow with pain management as planned.  She has an appointment next month.  She is reports a little bladder pressure, and the urine dip was unremarkable.  We will send off a UA with micro. Advised:  To the lab today and will call with results  Cool pad to back as you are using.  Take tylenol as needed according to the directions on the bottle.   Call back in 3 days if no better.   A total of 30 minutes of face to face time was spent with patient in gathering history and  doing physical exam with interpreter  relaying information back and forth. Also, in counseling and coordination of care, answering questions and discussing labs.    Follow-up: Return if symptoms worsen or fail to improve.   This visit occurred during the SARS-CoV-2 public health emergency.  Safety protocols were in place, including screening questions prior to the visit, additional usage of staff PPE, and extensive cleaning of exam room while observing appropriate contact time as indicated for disinfecting solutions.   Kimberly Mills, NP 

## 2020-01-02 NOTE — Telephone Encounter (Signed)
Patient aware and is okay with it not being ordered

## 2020-01-02 NOTE — Patient Instructions (Addendum)
Al laboratorio hoy y lo llamar con los [place]. [place]fre la almohadilla hacia atrs mientras la [place]. Tome tylenol segn sea necesario de acuerdo con las instrucciones del frasco. Vuelva a llamar en 3 das si no mejora  Disuria Dysuria La disuria es dolor o molestia al [employer]. El dolor o la molestia se pueden sentir en la parte del cuerpo que transporta la orina fuera de la vejiga (uretra) o en el tejido que rodea los genitales. El dolor tambin se puede sentir en la zona de la ingle, en la parte inferior del abdomen y de la zona lumbar. Quizs tenga que orinar con frecuencia o la sensacin repentina de tener que orinar (tenesmo vesical). La disuria puede afectar tanto a hombres como a mujeres, pero es ms comn en las mujeres. La causa puede deberse a muchos problemas diferentes:  Infeccin de las vas urinarias.  Clculos renales o en la vejiga.  Ciertas enfermedades de transmisin sexual (ETS), como la clamidia.  Deshidratacin.  Inflamacin de los tejidos de la vagina.  Uso de ciertos medicamentos.  Uso de ciertos jabones o productos perfumados que provocan irritacin. Siga estas indicaciones en su casa: Instrucciones generales  Controle su afeccin para detectar cualquier cambio.  Orine con frecuencia. Evite retener la orina durante largos perodos.  Despus de defecar, las mujeres deben limpiarse desde adelante hacia atrs, usando el papel higinico solo [place].  Orine despus de [employer].  Concurra a todas las visitas de control como se lo haya indicado el mdico. Esto es importante.  Si le realizaron pruebas para Product manager causa de la disuria, es su responsabilidad retirar los resultados de [place]. Consulte al mdico o pregunte en el departamento donde se realiza la prueba cundo estarn [employer]. Comida y bebida   Beba suficiente lquido como para Theatre manager la orina de color amarillo plido.  Evite la cafena, el t y el  alcohol. Estos productos pueden Scientist, research (medical) vejiga y Actuary disuria. En los hombres, el alcohol puede irritar la prstata. Medicamentos  [employer] de venta libre y los recetados solamente como se lo haya indicado el mdico.  Si le recetaron un antibitico, tmelo como se lo haya indicado el mdico. No deje de tomar el antibitico aunque comience a sentirse mejor. Comunquese con un mdico si:  Tiene fiebre.  Siente dolor en la espalda o a los costados del cuerpo.  Tiene nuseas o vmitos.  Observa sangre en la orina.  Est orinando con ms frecuencia que lo habitual. Solicite ayuda de inmediato si:  El dolor es intenso y no se Engineer, production con los medicamentos.  No puede comer ni beber sin vomitar.  Se siente confundido.  Tiene una frecuencia cardaca acelerada en reposo.  Tiene temblores o escalofros.  Se siente muy dbil. Resumen  La disuria es dolor o molestia al Garment/textile technologist. Existen muchas afecciones que pueden causar disuria.  Si tiene disuria, es posible que tenga que orinar con frecuencia o tenga la sensacin repentina de tener que orinar (tenesmo vesical).  Controle su afeccin para Actuary cambio. Concurra a todas las visitas de control como se lo haya indicado el mdico.  Asegrese de Garment/textile technologist con frecuencia y beber suficiente lquido para mantener la orina de color amarillo plido. Esta informacin no tiene Marine scientist el consejo del mdico. Asegrese de hacerle al mdico cualquier pregunta que tenga. Document Revised: 05/15/2017 Document Reviewed: 01/26/2017 Elsevier Patient Education  [place].

## 2020-01-03 LAB — COMPREHENSIVE METABOLIC PANEL
ALT: 61 U/L — ABNORMAL HIGH (ref 0–35)
AST: 30 U/L (ref 0–37)
Albumin: 4.7 g/dL (ref 3.5–5.2)
Alkaline Phosphatase: 88 U/L (ref 39–117)
BUN: 17 mg/dL (ref 6–23)
CO2: 29 mEq/L (ref 19–32)
Calcium: 10.2 mg/dL (ref 8.4–10.5)
Chloride: 101 mEq/L (ref 96–112)
Creatinine, Ser: 0.73 mg/dL (ref 0.40–1.20)
GFR: 89.13 mL/min (ref >60.00)
Glucose, Bld: 86 mg/dL (ref 70–99)
Potassium: 3.8 mEq/L (ref 3.5–5.1)
Sodium: 138 mEq/L (ref 135–145)
Total Bilirubin: 0.3 mg/dL (ref 0.2–1.2)
Total Protein: 7.5 g/dL (ref 6.0–8.3)

## 2020-01-03 LAB — URINE CULTURE
MICRO NUMBER:: 11203747
SPECIMEN QUALITY:: ADEQUATE

## 2020-01-03 LAB — CBC WITH DIFFERENTIAL
Basophils Absolute: 0.1 10*3/uL (ref 0.0–0.2)
Basos: 1 %
EOS (ABSOLUTE): 0 10*3/uL (ref 0.0–0.4)
Eos: 0 %
Hematocrit: 44.5 % (ref 34.0–46.6)
Hemoglobin: 14.8 g/dL (ref 11.1–15.9)
Immature Grans (Abs): 0.1 10*3/uL (ref 0.0–0.1)
Immature Granulocytes: 1 %
Lymphocytes Absolute: 1.9 10*3/uL (ref 0.7–3.1)
Lymphs: 18 %
MCH: 28.4 pg (ref 26.6–33.0)
MCHC: 33.3 g/dL (ref 31.5–35.7)
MCV: 85 fL (ref 79–97)
Monocytes Absolute: 0.8 10*3/uL (ref 0.1–0.9)
Monocytes: 8 %
Neutrophils Absolute: 7.5 10*3/uL — ABNORMAL HIGH (ref 1.4–7.0)
Neutrophils: 72 %
RBC: 5.22 x10E6/uL (ref 3.77–5.28)
RDW: 14.1 % (ref 11.7–15.4)
WBC: 10.3 10*3/uL (ref 3.4–10.8)

## 2020-01-03 LAB — URINALYSIS, ROUTINE W REFLEX MICROSCOPIC
Bilirubin Urine: NEGATIVE
Hgb urine dipstick: NEGATIVE
Ketones, ur: NEGATIVE
Leukocytes,Ua: NEGATIVE
Nitrite: NEGATIVE
RBC / HPF: NONE SEEN (ref 0–?)
Specific Gravity, Urine: 1.01 (ref 1.000–1.030)
Total Protein, Urine: NEGATIVE
Urine Glucose: NEGATIVE
Urobilinogen, UA: 0.2 (ref 0.0–1.0)
pH: 6 (ref 5.0–8.0)

## 2020-01-04 NOTE — Telephone Encounter (Signed)
Patient called in for lab results  ?

## 2020-01-04 NOTE — Telephone Encounter (Signed)
Spoke with patient about her lab results.

## 2020-01-05 ENCOUNTER — Other Ambulatory Visit: Payer: Self-pay | Admitting: Nurse Practitioner

## 2020-01-05 DIAGNOSIS — N644 Mastodynia: Secondary | ICD-10-CM

## 2020-01-06 ENCOUNTER — Telehealth: Payer: Self-pay | Admitting: Nurse Practitioner

## 2020-01-06 NOTE — Telephone Encounter (Signed)
lft vm for pt to call ofc to sch [place] mammo

## 2020-01-09 ENCOUNTER — Telehealth: Payer: Self-pay | Admitting: Nurse Practitioner

## 2020-01-09 NOTE — Telephone Encounter (Signed)
lft vm for pt to call [phone] option 3 and then 2 to sch.

## 2020-01-09 NOTE — Addendum Note (Signed)
Addended by: Denice Paradise A on: 01/09/2020 07:02 AM   Modules accepted: Orders

## 2020-01-09 NOTE — Telephone Encounter (Signed)
err

## 2020-01-16 ENCOUNTER — Other Ambulatory Visit (INDEPENDENT_AMBULATORY_CARE_PROVIDER_SITE_OTHER): Payer: Medicare Other

## 2020-01-16 ENCOUNTER — Other Ambulatory Visit: Payer: Self-pay

## 2020-01-16 DIAGNOSIS — R768 Other specified abnormal immunological findings in serum: Secondary | ICD-10-CM

## 2020-01-16 DIAGNOSIS — R7401 Elevation of levels of liver transaminase levels: Secondary | ICD-10-CM | POA: Diagnosis not present

## 2020-01-17 ENCOUNTER — Ambulatory Visit
Admission: RE | Admit: 2020-01-17 | Discharge: 2020-01-17 | Disposition: A | Discharge status: Home / Self Care | Payer: Medicare Other | Source: Ambulatory Visit | Attending: Nurse Practitioner | Admitting: Nurse Practitioner

## 2020-01-17 DIAGNOSIS — N644 Mastodynia: Secondary | ICD-10-CM

## 2020-01-17 NOTE — Addendum Note (Signed)
Addended by: Denice Paradise A on: 01/17/2020 03:02 PM   Modules accepted: Orders

## 2020-01-18 ENCOUNTER — Telehealth: Payer: Self-pay | Admitting: Nurse Practitioner

## 2020-01-18 LAB — HEPATITIS A ANTIBODY, IGM: Hep A IgM: NONREACTIVE

## 2020-01-18 LAB — ANTI-NUCLEAR AB-TITER (ANA TITER): ANA Titer 1: 1:320 {titer} — ABNORMAL HIGH

## 2020-01-18 LAB — CELIAC DISEASE PANEL
(tTG) Ab, IgA: >250 U/mL — ABNORMAL HIGH
(tTG) Ab, IgG: 5.6 U/mL
Gliadin IgA: >250 U/mL — ABNORMAL HIGH
Gliadin IgG: 112.5 U/mL — ABNORMAL HIGH
Immunoglobulin A: 310 mg/dL (ref 47–310)

## 2020-01-18 LAB — HEPATITIS B SURFACE ANTIGEN: Hepatitis B Surface Ag: NONREACTIVE

## 2020-01-18 LAB — HEPATITIS B SURFACE ANTIBODY,QUALITATIVE: Hep B S Ab: REACTIVE — AB

## 2020-01-18 LAB — ANA: Anti Nuclear Antibody (ANA): POSITIVE — AB

## 2020-01-18 LAB — HEPATITIS B CORE ANTIBODY, TOTAL: Hep B Core Total Ab: NONREACTIVE

## 2020-01-18 LAB — ANTI-SMOOTH MUSCLE ANTIBODY, IGG: Actin (Smooth Muscle) Antibody (IGG): <20 U (ref <20)

## 2020-01-18 LAB — HEPATITIS A ANTIBODY, TOTAL: Hepatitis A AB,Total: REACTIVE — AB

## 2020-01-18 LAB — MITOCHONDRIAL ANTIBODIES: Mitochondrial M2 Ab, IgG: <=20 U

## 2020-01-18 LAB — CERULOPLASMIN: Ceruloplasmin: 27 mg/dL (ref 18–53)

## 2020-01-18 NOTE — Telephone Encounter (Signed)
Pt returning call to get results

## 2020-01-19 ENCOUNTER — Ambulatory Visit
Admission: RE | Admit: 2020-01-19 | Discharge: 2020-01-19 | Disposition: A | Discharge status: Home / Self Care | Payer: Medicare Other | Source: Ambulatory Visit | Attending: Nurse Practitioner | Admitting: Nurse Practitioner

## 2020-01-19 ENCOUNTER — Other Ambulatory Visit: Payer: Self-pay

## 2020-01-19 DIAGNOSIS — R1011 Right upper quadrant pain: Secondary | ICD-10-CM

## 2020-01-19 DIAGNOSIS — R7401 Elevation of levels of liver transaminase levels: Secondary | ICD-10-CM

## 2020-01-29 NOTE — Progress Notes (Signed)
PROVIDER NOTE: Information contained herein reflects review and annotations entered in association with encounter. Interpretation of such information and data should be left to medically-trained personnel. Information provided to patient can be located elsewhere in the medical record under "Patient Instructions". Document created using STT-dictation technology, any transcriptional errors that may result from process are unintentional.    Patient: Cynthia Mcpherson  Service Category: E/M  Provider: Gaspar Cola, MD  DOB: 04/15/1960  DOS: 01/30/2020  Specialty: Interventional Pain Management  MRN: [id number]  Setting: Ambulatory outpatient  PCP: Marval Regal, NP  Type: Established Patient    Referring Provider: Marval Regal, NP  Location: Office  Delivery: Face-to-face     HPI  [name], a 59 y.o. year old female, is here today because of her Chronic pain syndrome [G89.4]. [name] primary complain today is Back Pain (Right ) Last encounter: My last encounter with her was on 12/27/2019. Pertinent problems: [name] has Lumbar radiculopathy; Chronic low back pain (Primary Area of Pain) (Left) w/o sciatica; Chronic hip pain (Left); Lumbar facet syndrome (Left); Failed back surgical syndrome; Lumbar facet arthropathy; Spondylosis without myelopathy or radiculopathy, lumbosacral region; DDD (degenerative disc disease), lumbosacral; Chronic pain syndrome; Chronic musculoskeletal pain; Breast pain, left; and Acute postoperative pain on their pertinent problem list. Pain Assessment: Severity of Chronic pain is reported as a 0-No pain/10. Location: Back Lower,Right/denies. Onset: Other (comment) (no pain). Quality: Other (Comment) (pain is relieved). Timing: Other (Comment) (no pain). Modifying factor(s): denies. Vitals:  height is 5' (1.524 m) and weight is 121 lb (54.9 kg). Her temporal temperature is 97.4 F (36.3 C) (abnormal). Her blood pressure is 114/75 and her pulse  is 98. Her respiration is 16 and oxygen saturation is 100%.   Reason for encounter: post-procedure assessment.  The patient indicates being completely pain-free.  He was reminded that the radiofrequency ablations will wear off with time.  She was also reminded that when it does, it can be repeated.  She understood and accepted.  She indicates that she may have to move, primarily secondary to her husband wanting to move back to where they came from.  She voiced today that she is not entirely happy with that, especially because she is the one that has to do a lot of the moving and this tends to aggravate her back.  She was reminded to avoid carrying anything heavy and twisting at the level of her waist as this would aggravate her facet problems.  She understood and accepted.  Today I have provided patient with refills on her Flexeril.  I was going to transfer this to her PCP, but she indicates that since she is moving she may not be seen the same PCP again.  RTCB: PRN  Post-Procedure Evaluation  Procedure (12/27/2019): Therapeutic left lumbar facet RFA #1 under fluoroscopic guidance and IV sedation Pre-procedure pain level: 9/10 Post-procedure: 0/10 (100% relief)  Sedation: Sedation provided.  Effectiveness during initial hour after procedure(Ultra-Short Term Relief): 100 %.  Local anesthetic used: Long-acting (4-6 hours) Effectiveness: Defined as any analgesic benefit obtained secondary to the administration of local anesthetics. This carries significant diagnostic value as to the etiological location, or anatomical origin, of the pain. Duration of benefit is expected to coincide with the duration of the local anesthetic used.  Effectiveness during initial 4-6 hours after procedure(Short-Term Relief): 100 %.  Long-term benefit: Defined as any relief past the pharmacologic duration of the local anesthetics.  Effectiveness past the initial 6 hours after procedure(Long-Term  Relief): 100 %.  Current  benefits: Defined as benefit that persist at this time.   Analgesia:  90-100% better Function: [name] reports improvement in function ROM: [name] reports improvement in ROM  Pharmacotherapy Assessment   Analgesic: No opioid analgesics prescribed by our practice.   Monitoring: [place] PMP: PDMP reviewed during this encounter.       Pharmacotherapy: No side-effects or adverse reactions reported. Compliance: No problems identified. Effectiveness: Clinically acceptable.  No notes on file  UDS: No results found for: SUMMARY   ROS  Constitutional: Denies any fever or chills Gastrointestinal: No reported hemesis, hematochezia, vomiting, or acute GI distress Musculoskeletal: Denies any acute onset joint swelling, redness, loss of ROM, or weakness Neurological: No reported episodes of acute onset apraxia, aphasia, dysarthria, agnosia, amnesia, paralysis, loss of coordination, or loss of consciousness  Medication Review  Levothyroxine Sodium, acetaminophen, cyclobenzaprine, ferrous sulfate, multivitamin with minerals, and omeprazole  History Review  Allergy: [name] is allergic to influenza vaccines, iodinated diagnostic agents, latex, shellfish allergy, and iodine. Drug: [name]  reports no history of drug use. Alcohol:  reports previous alcohol use. Tobacco:  reports that she has never smoked. She has never used smokeless tobacco. Social: [name]  reports that she has never smoked. She has never used smokeless tobacco. She reports previous alcohol use. She reports that she does not use drugs. Medical:  has a past medical history of GERD (gastroesophageal reflux disease), Thrombocytosis, and Thyroid disease. Surgical: [name]  has a past surgical history that includes Back surgery. Family: family history includes Hypertension in her brother; Multiple sclerosis in her sister; Psoriasis in her mother; Stroke in her sister.  Laboratory Chemistry Profile   Renal Lab Results   Component Value Date   BUN 17 01/02/2020   CREATININE 0.73 01/02/2020   GFR 89.13 01/02/2020   GFRAA >60 11/15/2019   GFRNONAA >60 11/15/2019     Hepatic Lab Results  Component Value Date   AST 30 01/02/2020   ALT 61 (H) 01/02/2020   ALBUMIN 4.7 01/02/2020   ALKPHOS 88 01/02/2020     Electrolytes Lab Results  Component Value Date   NA 138 01/02/2020   K 3.8 01/02/2020   CL 101 01/02/2020   CALCIUM 10.2 01/02/2020     Bone No results found for: VD25OH, VD125OH2TOT, OA4166AY3, KZ6010XN2, 25OHVITD1, 25OHVITD2, 25OHVITD3, TESTOFREE, TESTOSTERONE   Inflammation (CRP: Acute Phase) (ESR: Chronic Phase) Lab Results  Component Value Date   ESRSEDRATE 6 07/26/2019       Note: Above Lab results reviewed.  Recent Imaging Review  US Abdomen Complete CLINICAL DATA:  Elevated LFTs, RIGHT upper quadrant pain  EXAM: ABDOMEN ULTRASOUND COMPLETE  COMPARISON:  None  FINDINGS: Gallbladder: Normally distended without stones or wall thickening. No pericholecystic fluid or sonographic Murphy sign.  Common bile duct: Diameter: 5 mm, normal  Liver: Echogenic parenchyma, likely fatty infiltration though this can be seen with cirrhosis and certain infiltrative disorders. No focal hepatic mass or nodularity. No intrahepatic biliary dilatation. Portal vein is patent on color Doppler imaging with normal direction of blood flow towards the liver.  IVC: Normal appearance  Pancreas: Normal appearance  Spleen: Normal appearance, 7.0 cm length  Right Kidney: Length: 9.5 cm. Normal morphology without mass or hydronephrosis.  Left Kidney: Length: 10.0 cm. Normal morphology without mass or hydronephrosis.  Abdominal aorta: Normal caliber  Other findings: No free fluid  IMPRESSION: Probable fatty infiltration of liver as above.  Otherwise negative exam.  Electronically Signed  By: Lavonia Dana M.D.   On: 01/19/2020 13:44 Note: Reviewed        Physical Exam  General  appearance: Well nourished, well developed, and well hydrated. In no apparent acute distress Mental status: Alert, oriented x 3 (person, place, & time)       Respiratory: No evidence of acute respiratory distress Eyes: PERLA Vitals: BP 114/75 (BP Location: Right Arm, Patient Position: Sitting, Cuff Size: Normal)   Pulse 98   Temp (!) 97.4 F (36.3 C) (Temporal)   Resp 16   Ht 5' (1.524 m)   Wt 121 lb (54.9 kg)   SpO2 100%   BMI 23.63 kg/m  BMI: Estimated body mass index is 23.63 kg/m as calculated from the following:   Height as of this encounter: 5' (1.524 m).   Weight as of this encounter: 121 lb (54.9 kg). Ideal: Ideal body weight: 45.5 kg (100 lb 4.9 oz) Adjusted ideal body weight: 49.3 kg (108 lb 9.4 oz)  Assessment   Status Diagnosis  Controlled Controlled Controlled 1. Chronic pain syndrome   2. Lumbar facet syndrome (Left)   3. Chronic low back pain (Primary Area of Pain) (Left) w/o sciatica   4. Chronic musculoskeletal pain      Updated Problems: No problems updated.  Plan of Care  Problem-specific:  No problem-specific Assessment & Plan notes found for this encounter.  [name] has a current medication list which includes the following long-term medication(s): ferrous sulfate, levothyroxine sodium, omeprazole, and [START ON 02/28/2020] cyclobenzaprine.  Pharmacotherapy (Medications Ordered): Meds ordered this encounter  Medications  . cyclobenzaprine (FLEXERIL) 10 MG tablet    Sig: Take 1 tablet (10 mg total) by mouth 3 (three) times daily as needed for muscle spasms.    Dispense:  90 tablet    Refill:  2    Fill one day early if pharmacy is closed on scheduled refill date. May substitute for generic if available.   Orders:  No orders of the defined types were placed in this encounter.  Follow-up plan:   Return if symptoms worsen or fail to improve.      Interventional management options: Procedure(s) under consideration:  Diagnostic  left-sided lumbar facet block #3 under fluoroscopic guidance and IV sedation  Possible left-sided lumbar facet RFA        Recent Visits Date Type Provider Dept  12/27/19 Procedure visit Milinda Pointer, MD [facility]  11/30/19 Office Visit Milinda Pointer, MD [facility]  Showing recent visits within past 90 days and meeting all other requirements Today's Visits Date Type Provider Dept  01/30/20 Office Visit Milinda Pointer, MD [facility]  Showing today's visits and meeting all other requirements Future Appointments No visits were found meeting these conditions. Showing future appointments within next 90 days and meeting all other requirements  I discussed the assessment and treatment plan with the patient. The patient was provided an opportunity to ask questions and all were answered. The patient agreed with the plan and demonstrated an understanding of the instructions.  Patient advised to call back or seek an in-person evaluation if the symptoms or condition worsens.  Duration of encounter: 30 minutes.  Note by: Gaspar Cola, MD Date: 01/30/2020; Time: 11:33 AM

## 2020-01-30 ENCOUNTER — Ambulatory Visit: Payer: Medicare Other | Attending: Pain Medicine | Admitting: Pain Medicine

## 2020-01-30 ENCOUNTER — Other Ambulatory Visit: Payer: Self-pay

## 2020-01-30 ENCOUNTER — Encounter: Payer: Self-pay | Admitting: Pain Medicine

## 2020-01-30 VITALS — BP 114/75 | HR 98 | Temp 97.4°F | Resp 16 | Ht 60.0 in | Wt 121.0 lb

## 2020-01-30 DIAGNOSIS — G894 Chronic pain syndrome: Secondary | ICD-10-CM | POA: Insufficient documentation

## 2020-01-30 DIAGNOSIS — M545 Low back pain, unspecified: Secondary | ICD-10-CM

## 2020-01-30 DIAGNOSIS — M7918 Myalgia, other site: Secondary | ICD-10-CM | POA: Insufficient documentation

## 2020-01-30 DIAGNOSIS — G8929 Other chronic pain: Secondary | ICD-10-CM | POA: Diagnosis present

## 2020-01-30 DIAGNOSIS — M47816 Spondylosis without myelopathy or radiculopathy, lumbar region: Secondary | ICD-10-CM | POA: Diagnosis present

## 2020-01-30 MED ORDER — CYCLOBENZAPRINE HCL 10 MG PO TABS: 10.0000 mg | ORAL_TABLET | Freq: Three times a day (TID) | ORAL | 2 refills | Status: AC | PRN

## 2020-01-30 NOTE — Patient Instructions (Signed)

## 2020-02-01 ENCOUNTER — Ambulatory Visit: Payer: Medicare Other | Admitting: Pain Medicine

## 2020-03-05 ENCOUNTER — Telehealth: Payer: Medicare Other

## 2020-04-16 ENCOUNTER — Other Ambulatory Visit: Payer: Self-pay

## 2020-04-16 ENCOUNTER — Telehealth: Payer: Medicare Other

## 2020-05-15 ENCOUNTER — Inpatient Hospital Stay: Payer: Medicare Other

## 2020-05-15 ENCOUNTER — Inpatient Hospital Stay: Payer: Medicare Other | Admitting: Oncology

## 2020-05-31 ENCOUNTER — Inpatient Hospital Stay: Payer: Medicare Other

## 2020-05-31 ENCOUNTER — Inpatient Hospital Stay: Payer: Medicare Other | Admitting: Oncology

## 2020-06-04 ENCOUNTER — Inpatient Hospital Stay (HOSPITAL_BASED_OUTPATIENT_CLINIC_OR_DEPARTMENT_OTHER): Payer: Medicare Other | Admitting: Oncology

## 2020-06-04 ENCOUNTER — Other Ambulatory Visit: Payer: Self-pay

## 2020-06-04 ENCOUNTER — Inpatient Hospital Stay: Payer: Medicare Other | Attending: Oncology

## 2020-06-04 ENCOUNTER — Encounter: Payer: Self-pay | Admitting: Oncology

## 2020-06-04 VITALS — BP 104/74 | HR 92 | Temp 97.3°F | Wt 123.6 lb

## 2020-06-04 DIAGNOSIS — Z823 Family history of stroke: Secondary | ICD-10-CM | POA: Insufficient documentation

## 2020-06-04 DIAGNOSIS — E039 Hypothyroidism, unspecified: Secondary | ICD-10-CM | POA: Diagnosis not present

## 2020-06-04 DIAGNOSIS — Z8249 Family history of ischemic heart disease and other diseases of the circulatory system: Secondary | ICD-10-CM | POA: Diagnosis not present

## 2020-06-04 DIAGNOSIS — Z79899 Other long term (current) drug therapy: Secondary | ICD-10-CM | POA: Diagnosis not present

## 2020-06-04 DIAGNOSIS — M549 Dorsalgia, unspecified: Secondary | ICD-10-CM | POA: Diagnosis not present

## 2020-06-04 DIAGNOSIS — D75839 Thrombocytosis, unspecified: Secondary | ICD-10-CM | POA: Diagnosis present

## 2020-06-04 DIAGNOSIS — R5383 Other fatigue: Secondary | ICD-10-CM | POA: Insufficient documentation

## 2020-06-04 DIAGNOSIS — Z888 Allergy status to other drugs, medicaments and biological substances status: Secondary | ICD-10-CM | POA: Diagnosis not present

## 2020-06-04 DIAGNOSIS — Z840 Family history of diseases of the skin and subcutaneous tissue: Secondary | ICD-10-CM | POA: Diagnosis not present

## 2020-06-04 DIAGNOSIS — Z8269 Family history of other diseases of the musculoskeletal system and connective tissue: Secondary | ICD-10-CM | POA: Diagnosis not present

## 2020-06-04 DIAGNOSIS — Z887 Allergy status to serum and vaccine status: Secondary | ICD-10-CM | POA: Insufficient documentation

## 2020-06-04 DIAGNOSIS — R7401 Elevation of levels of liver transaminase levels: Secondary | ICD-10-CM | POA: Diagnosis not present

## 2020-06-04 DIAGNOSIS — K219 Gastro-esophageal reflux disease without esophagitis: Secondary | ICD-10-CM | POA: Diagnosis not present

## 2020-06-04 LAB — CBC WITH DIFFERENTIAL/PLATELET
Abs Immature Granulocytes: 0.02 10*3/uL (ref 0.00–0.07)
Basophils Absolute: 0 10*3/uL (ref 0.0–0.1)
Basophils Relative: 1 %
Eosinophils Absolute: 0.1 10*3/uL (ref 0.0–0.5)
Eosinophils Relative: 2 %
HCT: 42.8 % (ref 36.0–46.0)
Hemoglobin: 13.9 g/dL (ref 12.0–15.0)
Immature Granulocytes: 0 %
Lymphocytes Relative: 33 %
Lymphs Abs: 1.9 10*3/uL (ref 0.7–4.0)
MCH: 28.1 pg (ref 26.0–34.0)
MCHC: 32.5 g/dL (ref 30.0–36.0)
MCV: 86.5 fL (ref 80.0–100.0)
Monocytes Absolute: 0.3 10*3/uL (ref 0.1–1.0)
Monocytes Relative: 6 %
Neutro Abs: 3.4 10*3/uL (ref 1.7–7.7)
Neutrophils Relative %: 58 %
Platelets: 432 10*3/uL — ABNORMAL HIGH (ref 150–400)
RBC: 4.95 MIL/uL (ref 3.87–5.11)
RDW: 14.6 % (ref 11.5–15.5)
WBC: 5.9 10*3/uL (ref 4.0–10.5)
nRBC: 0 % (ref 0.0–0.2)

## 2020-06-04 LAB — IRON AND TIBC
Iron: 70 ug/dL (ref 28–170)
Saturation Ratios: 19 % (ref 10.4–31.8)
TIBC: 370 ug/dL (ref 250–450)
UIBC: 300 ug/dL

## 2020-06-04 LAB — FERRITIN: Ferritin: 52 ng/mL (ref 11–307)

## 2020-06-04 NOTE — Progress Notes (Signed)
Hematology/Oncology Consult note [facility]  Telephone:([phone] [phone]  Patient Care Team: Marval Regal, NP as PCP - General (Nurse Practitioner)   Name of the patient: Cynthia Mcpherson  [id number]  24-Apr-1960   Date of visit: 06/04/20  Diagnosis- persistent thrombocytosis possibly reactive  Chief complaint/ Reason for visit- routine follow-up of thrombocytosis  Heme/Onc history: Patient is a 60 year old female with a past medical history significant for hypothyroidism. She has been referred to [place] for thrombocytosis. Most recent labs from 06/23/2019 showed white count of 8, H&H of 13.3/40 with an MCV of 83 and a platelet count of 508. Prior to that her platelet count was 474 in September 2020. Patient currently feels well and has occasional back pain and fatigue but denies other complaints. Denies any prior history of thromboembolic events heart attacks or TIAs. She is currently not taking any blood thinner.  Results of blood work from 07/26/2019 were as follows: CBC showed white count of 7.3, H&H of 13.7/41.8 and a platelet count of 432. Ferritin was low normal at 23 and iron studies were normal. JAK2, CAL R, MPL mutation was negative. Smear review unremarkable. ESR normal. CMP normal except for mildly elevated ast and alt of 48 and 72 respectively   Interval history-history obtained with the help of a Spanish interpreter.  Patient reports she had an episode of fever 103 about 10 days ago which only lasted for a day.  She has not had any fever since then.  Denies any history of blood clots or strokelike symptoms in the interim.  ECOG PS- 0 Pain scale- 0   Review of systems- Review of Systems  Constitutional: Negative for chills, fever, malaise/fatigue and weight loss.  HENT: Negative for congestion, ear discharge and nosebleeds.   Eyes: Negative for blurred vision.  Respiratory: Negative for cough, hemoptysis, sputum  production, shortness of breath and wheezing.   Cardiovascular: Negative for chest pain, palpitations, orthopnea and claudication.  Gastrointestinal: Negative for abdominal pain, blood in stool, constipation, diarrhea, heartburn, melena, nausea and vomiting.  Genitourinary: Negative for dysuria, flank pain, frequency, hematuria and urgency.  Musculoskeletal: Negative for back pain, joint pain and myalgias.  Skin: Negative for rash.  Neurological: Negative for dizziness, tingling, focal weakness, seizures, weakness and headaches.  Endo/Heme/Allergies: Does not bruise/bleed easily.  Psychiatric/Behavioral: Negative for depression and suicidal ideas. The patient does not have insomnia.       Allergies  Allergen Reactions  . Influenza Vaccines Hives and Rash  . Iodinated Diagnostic Agents     Rash and breathing problems  . Latex Hives and Rash  . Shellfish Allergy Hives and Rash  . Iodine Itching and Rash     Past Medical History:  Diagnosis Date  . GERD (gastroesophageal reflux disease)   . Thrombocytosis   . Thyroid disease    hypothyroidism      Past Surgical History:  Procedure Laterality Date  . BACK SURGERY      Social History   Socioeconomic History  . Marital status: Married    Spouse name: Not on file  . Number of children: Not on file  . Years of education: Not on file  . Highest education level: Not on file  Occupational History  . Not on file  Tobacco Use  . Smoking status: Never Smoker  . Smokeless tobacco: Never Used  Substance and Sexual Activity  . Alcohol use: Not Currently  . Drug use: Never  . Sexual activity: Yes  Other  Topics Concern  . Not on file  Social History Narrative   Married lives with her husband, has 1 grown daughter.  Retired Marine scientist.  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    Social Determinants of Health   Financial Resource Strain: Not on file  Food Insecurity: Not on file  Transportation Needs: Not on file  Physical Activity: Not  on file  Stress: Not on file  Social Connections: Not on file  Intimate Partner Violence: Not on file    Family History  Problem Relation Age of Onset  . Psoriasis Mother   . Multiple sclerosis Sister   . Stroke Sister   . Hypertension Brother   . Breast cancer Neg Hx      Current Outpatient Medications:  .  acetaminophen (TYLENOL) 500 MG tablet, Take 500 mg by mouth every 6 (six) hours as needed., Disp: , Rfl:  .  cyclobenzaprine (FLEXERIL) 10 MG tablet, Take 1 tablet (10 mg total) by mouth 3 (three) times daily as needed for muscle spasms., Disp: 90 tablet, Rfl: 2 .  ferrous sulfate 325 (65 FE) MG tablet, Take 325 mg by mouth daily with breakfast., Disp: , Rfl:  .  Levothyroxine Sodium 88 MCG CAPS, Take 88 mcg by mouth daily., Disp: , Rfl:  .  Multiple Vitamins-Minerals (MULTIVITAMIN WITH MINERALS) tablet, Take 1 tablet by mouth daily., Disp: , Rfl:  .  omeprazole (PRILOSEC) 20 MG capsule, Take 20 mg by mouth daily as needed. , Disp: , Rfl:   Physical exam:  Vitals:   06/04/20 0910  BP: 104/74  Pulse: 92  Temp: (!) 97.3 F (36.3 C)  TempSrc: Tympanic  SpO2: 100%  Weight: 123 lb 9.6 oz (56.1 kg)   Physical Exam Constitutional:      General: She is not in acute distress. Cardiovascular:     Rate and Rhythm: Normal rate and regular rhythm.     Heart sounds: Normal heart sounds.  Pulmonary:     Effort: Pulmonary effort is normal.     Breath sounds: Normal breath sounds.  Abdominal:     General: Bowel sounds are normal.     Palpations: Abdomen is soft.  Skin:    General: Skin is warm and dry.  Neurological:     Mental Status: She is alert and oriented to person, place, and time.      CMP Latest Ref Rng & Units 01/02/2020  Glucose 70 - 99 mg/dL 86  BUN 6 - 23 mg/dL 17  Creatinine 0.40 - 1.20 mg/dL 0.73  Sodium 135 - 145 mEq/L 138  Potassium 3.5 - 5.1 mEq/L 3.8  Chloride 96 - 112 mEq/L 101  CO2 19 - 32 mEq/L 29  Calcium 8.4 - 10.5 mg/dL 10.2  Total Protein  6.0 - 8.3 g/dL 7.5  Total Bilirubin 0.2 - 1.2 mg/dL 0.3  Alkaline Phos 39 - 117 U/L 88  AST 0 - 37 U/L 30  ALT 0 - 35 U/L 61(H)   CBC Latest Ref Rng & Units 06/04/2020  WBC 4.0 - 10.5 K/uL 5.9  Hemoglobin 12.0 - 15.0 g/dL 13.9  Hematocrit 36.0 - 46.0 % 42.8  Platelets 150 - 400 K/uL 432(H)     Assessment and plan- Patient is a 60 y.o. female with history of thrombocytosis here for routine follow-up  Patient's platelet count has remained stable mostly in the 400s without a clear increasing trend.  Iron studies are presently normal.  CAL R and MPL and JAK2 mutations negative.  She has not  had any thrombotic events.  I am inclined to monitor this conservatively without the need for a bone marrow biopsy.  Typically with a myeloproliferative disorder we would expect platelet counts to increase steadily.  Repeat CBC with differential in 6 months in 1 year and I will see her back in 1 year.  She will call us if her counts are checked elsewhere and are significantly elevated or if she has any signs and symptoms of thrombosis including all but not limited to leg swelling worsening shortness of breath or strokelike symptoms   Visit Diagnosis 1. Thrombocytosis      Dr. Randa Evens, MD, MPH [facility] [phone] 06/04/2020 10:22 AM

## 2020-09-07 ENCOUNTER — Other Ambulatory Visit: Payer: Self-pay | Admitting: Family Medicine

## 2020-09-07 ENCOUNTER — Ambulatory Visit
Admission: RE | Admit: 2020-09-07 | Discharge: 2020-09-07 | Disposition: A | Discharge status: Home / Self Care | Payer: Medicare Other | Source: Ambulatory Visit | Attending: Family Medicine | Admitting: Family Medicine

## 2020-09-07 ENCOUNTER — Other Ambulatory Visit: Payer: Self-pay

## 2020-09-07 DIAGNOSIS — R1011 Right upper quadrant pain: Secondary | ICD-10-CM

## 2020-12-04 ENCOUNTER — Other Ambulatory Visit: Payer: Self-pay

## 2020-12-04 ENCOUNTER — Inpatient Hospital Stay: Payer: Medicare Other | Attending: Oncology

## 2020-12-04 DIAGNOSIS — D75839 Thrombocytosis, unspecified: Secondary | ICD-10-CM | POA: Diagnosis not present

## 2020-12-04 LAB — CBC WITH DIFFERENTIAL/PLATELET
Abs Immature Granulocytes: 0.02 10*3/uL (ref 0.00–0.07)
Basophils Absolute: 0.1 10*3/uL (ref 0.0–0.1)
Basophils Relative: 1 %
Eosinophils Absolute: 0.2 10*3/uL (ref 0.0–0.5)
Eosinophils Relative: 2 %
HCT: 41.3 % (ref 36.0–46.0)
Hemoglobin: 13.4 g/dL (ref 12.0–15.0)
Immature Granulocytes: 0 %
Lymphocytes Relative: 25 %
Lymphs Abs: 2 10*3/uL (ref 0.7–4.0)
MCH: 28.3 pg (ref 26.0–34.0)
MCHC: 32.4 g/dL (ref 30.0–36.0)
MCV: 87.3 fL (ref 80.0–100.0)
Monocytes Absolute: 0.6 10*3/uL (ref 0.1–1.0)
Monocytes Relative: 7 %
Neutro Abs: 5.1 10*3/uL (ref 1.7–7.7)
Neutrophils Relative %: 65 %
Platelets: 396 10*3/uL (ref 150–400)
RBC: 4.73 MIL/uL (ref 3.87–5.11)
RDW: 14.4 % (ref 11.5–15.5)
WBC: 7.8 10*3/uL (ref 4.0–10.5)
nRBC: 0 % (ref 0.0–0.2)

## 2020-12-17 ENCOUNTER — Other Ambulatory Visit: Payer: Self-pay | Admitting: Family Medicine

## 2020-12-17 DIAGNOSIS — Z1231 Encounter for screening mammogram for malignant neoplasm of breast: Secondary | ICD-10-CM

## 2021-01-11 NOTE — Progress Notes (Signed)
PROVIDER NOTE: Information contained herein reflects review and annotations entered in association with encounter. Interpretation of such information and data should be left to medically-trained personnel. Information provided to patient can be located elsewhere in the medical record under "Patient Instructions". Document created using STT-dictation technology, any transcriptional errors that may result from process are unintentional.    Patient: Cynthia Mcpherson  Service Category: E/M  Provider: Gaspar Cola, MD  DOB: Dec 08, 1960  DOS: 01/14/2021  Specialty: Interventional Pain Management  MRN: [id number]  Setting: Ambulatory outpatient  PCP: Marval Regal, NP  Type: Established Patient    Referring Provider: Marval Regal, NP  Location: Office  Delivery: Face-to-face     HPI  Cynthia Mcpherson, a 60 y.o. year old female, is here today because of her Left thigh pain [M79.652]. [name] primary complain today is Back Pain (Lumbar bilateral ) Last encounter: My last encounter with her was on Visit date not found. Pertinent problems: Ms. Alvira Mcpherson has Lumbar radiculopathy; Chronic low back pain (1ry area of Pain) (Left) w/o sciatica; Chronic hip pain (Left); Lumbar facet syndrome (Left); Failed back surgical syndrome; Lumbar facet arthropathy; Spondylosis without myelopathy or radiculopathy, lumbosacral region; DDD (degenerative disc disease), lumbosacral; Chronic pain syndrome; Chronic musculoskeletal pain; Breast pain, left; Acute bilateral thoracic back pain; Neck sprain and strain; Spasm of muscle; Cervicalgia; Lumbosacral radiculopathy at S1 (Left); Chronic lower extremity pain (Left); Chronic radicular of lower extremity pain (Left); Lower extremity weakness (Left); Pins and needles sensation of foot (Left); Other intervertebral disc degeneration, lumbar region; Chronic thigh pain (1ry area of Pain) (Left); and Chronic low back pain (2ry area of Pain) (Left) w/ sciatica (Left)  on their pertinent problem list. Pain Assessment: Severity of Chronic pain is reported as a 2 /10. Location: Back Left, Lower/into bottoms of buttocks and into the thigh. Onset: More than a month ago. Quality: Constant, Discomfort, Burning, Stabbing. Timing: Constant. Modifying factor(s): rest and ice. Vitals:  height is 5' (1.524 m) and weight is 120 lb (54.4 kg). Her temporal temperature is 97.2 F (36.2 C) (abnormal). Her blood pressure is 115/76 and her pulse is 102 (abnormal). Her respiration is 16 and oxygen saturation is 100%.   Reason for encounter: new problems..  Patient last seen on 01/30/2020 s/p left lumbar facet RFA #1 on 12/27/2019.  The patient indicates that she would have return sooner, but she had COVID-19 and things got complicated for her.  She refers that she has been having quite a bit of pain on the left lower extremity, especially in the thigh area and going all the way down into her foot where she has been experiencing tingling sensations in the bottom of her foot.  She has also had(Intermittent) weakness of the left lower extremity where she has been very close to falling.  Today's physical exam shows pain on hyperextension on rotation towards the left side.  Negative Patrick maneuver on the left side, but upon testing the right side she experienced pain on the left lower back.  When I attempt to stabilize her hip this actually worsen her pain rather than improvement.  In addition, the patient was unable to toe walk with the left side, but she had no problem on heel walking.  Based on today's exam, the patient appears to be having left-sided S1 radiculopathy.  Today I will be ordering x-rays of the lumbar spine on flexion and extension as well as an MRI of the lumbar spine.  Pharmacotherapy Assessment  Analgesic: No opioid  analgesics prescribed by our practice.   Monitoring:  PMP: PDMP reviewed during this encounter.       Pharmacotherapy: No side-effects or adverse  reactions reported. Compliance: No problems identified. Effectiveness: Clinically acceptable.  No notes on file  UDS:  No results found for: SUMMARY   ROS  Constitutional: Denies any fever or chills Gastrointestinal: No reported hemesis, hematochezia, vomiting, or acute GI distress Musculoskeletal: Denies any acute onset joint swelling, redness, loss of ROM, or weakness Neurological: No reported episodes of acute onset apraxia, aphasia, dysarthria, agnosia, amnesia, paralysis, loss of coordination, or loss of consciousness  Medication Review  Levothyroxine Sodium, acetaminophen, cyclobenzaprine, ferrous sulfate, methocarbamol, multivitamin with minerals, and omeprazole  History Review  Allergy: Ms. Alvira Mcpherson is allergic to influenza vaccines, iodinated diagnostic agents, latex, shellfish allergy, and iodine. Drug: Ms. Alvira Mcpherson  reports no history of drug use. Alcohol:  reports that she does not currently use alcohol. Tobacco:  reports that she has never smoked. She has never used smokeless tobacco. Social: Ms. Alvira Mcpherson  reports that she has never smoked. She has never used smokeless tobacco. She reports that she does not currently use alcohol. She reports that she does not use drugs. Medical:  has a past medical history of GERD (gastroesophageal reflux disease), Thrombocytosis, and Thyroid disease. Surgical: Ms. Alvira Mcpherson  has a past surgical history that includes Back surgery. Family: family history includes Hypertension in her brother; Multiple sclerosis in her sister; Psoriasis in her mother; Stroke in her sister.  Laboratory Chemistry Profile   Renal Lab Results  Component Value Date   BUN 17 01/02/2020   CREATININE 0.73 01/02/2020   GFR 89.13 01/02/2020   GFRAA >60 11/15/2019   GFRNONAA >60 11/15/2019    Hepatic Lab Results  Component Value Date   AST 30 01/02/2020   ALT 61 (H) 01/02/2020   ALBUMIN 4.7 01/02/2020   ALKPHOS 88 01/02/2020    Electrolytes Lab Results  Component  Value Date   NA 138 01/02/2020   K 3.8 01/02/2020   CL 101 01/02/2020   CALCIUM 10.2 01/02/2020    Bone No results found for: VD25OH, VD125OH2TOT, DJ2426ST4, HD6222LN9, 25OHVITD1, 25OHVITD2, 25OHVITD3, TESTOFREE, TESTOSTERONE  Inflammation (CRP: Acute Phase) (ESR: Chronic Phase) Lab Results  Component Value Date   ESRSEDRATE 6 07/26/2019         Note: Above Lab results reviewed.  Recent Imaging Review  US Abdomen Limited RUQ (LIVER/GB) CLINICAL DATA:  Right upper quadrant pain for 1 week  EXAM: ULTRASOUND ABDOMEN LIMITED RIGHT UPPER QUADRANT  COMPARISON:  01/19/2020  FINDINGS: Gallbladder:  No gallstones or wall thickening visualized. No sonographic Murphy sign noted by sonographer.  Common bile duct:  Diameter: 4 mm  Liver:  No focal lesion.  Diffusely increased parenchymal echogenicity.  Portal vein is patent on color Doppler imaging with normal direction of blood flow towards the liver.  Other: None.  IMPRESSION: Diffuse increased echogenicity of the hepatic parenchyma is a nonspecific indicator of hepatocellular dysfunction, most commonly steatosis.  Electronically Signed   By: Miachel Roux M.D.   On: 09/07/2020 13:43 Note: Reviewed        Physical Exam  General appearance: Well nourished, well developed, and well hydrated. In no apparent acute distress Mental status: Alert, oriented x 3 (person, place, & time)       Respiratory: No evidence of acute respiratory distress Eyes: PERLA Vitals: BP 115/76 (BP Location: Right Arm, Patient Position: Sitting, Cuff Size: Normal)   Pulse (!) 102   Temp (!)  97.2 F (36.2 C) (Temporal)   Resp 16   Ht 5' (1.524 m)   Wt 120 lb (54.4 kg)   SpO2 100%   BMI 23.44 kg/m  BMI: Estimated body mass index is 23.44 kg/m as calculated from the following:   Height as of this encounter: 5' (1.524 m).   Weight as of this encounter: 120 lb (54.4 kg). Ideal: Ideal body weight: 45.5 kg (100 lb 4.9 oz) Adjusted ideal  body weight: 49.1 kg (108 lb 3 oz)  Assessment   Status Diagnosis  New problem New problem New problem 1. Chronic thigh pain (1ry area of Pain) (Left)   2. Lumbosacral radiculopathy at S1 (Left)   3. Chronic radicular of lower extremity pain (Left)   4. Chronic lower extremity pain (Left)   5. Lower extremity weakness (Left)   6. Chronic hip pain (Left)   7. Pins and needles sensation of foot (Left)   8. Chronic low back pain (2ry area of Pain) (Left) w/ sciatica (Left)   9. Other intervertebral disc degeneration, lumbar region   10. Lumbar facet arthropathy   11. Chronic musculoskeletal pain   12. Chronic pain syndrome   13. Encounter for chronic pain management      Updated Problems: Problem  Neck Sprain and Strain  Spasm of Muscle  Cervicalgia  Lumbosacral radiculopathy at S1 (Left)  Chronic lower extremity pain (Left)  Chronic radicular of lower extremity pain (Left)  Lower extremity weakness (Left)  Pins and needles sensation of foot (Left)  Other Intervertebral Disc Degeneration, Lumbar Region  Chronic thigh pain (1ry area of Pain) (Left)  Chronic low back pain (2ry area of Pain) (Left) w/ sciatica (Left)  Acute Bilateral Thoracic Back Pain  Chronic low back pain (1ry area of Pain) (Left) w/o sciatica  Chronic hip pain (Left)  Dysuria  Encounter for Immunization  Acute Postoperative Pain (Resolved)    Plan of Care  Problem-specific:  No problem-specific Assessment & Plan notes found for this encounter.  Ms. Kameo Bains has a current medication list which includes the following long-term medication(s): cyclobenzaprine, ferrous sulfate, levothyroxine sodium, methocarbamol, omeprazole, and tirosint.  Pharmacotherapy (Medications Ordered): Meds ordered this encounter  Medications   methocarbamol (ROBAXIN) 500 MG tablet    Sig: Take 1 tablet (500 mg total) by mouth every 8 (eight) hours as needed for muscle spasms.    Dispense:  90 tablet    Refill:  0     Do not place this medication, or any other prescription from our practice, on "Automatic Refill". Patient may have prescription filled one day early if pharmacy is closed on scheduled refill date.    Orders:  Orders Placed This Encounter  Procedures   DG Lumbar Spine Complete W/Bend    Patient presents with axial pain with possible radicular component. Please assist [place] in identifying specific level(s) and laterality of any additional findings such as: 1. Facet (Zygapophyseal) joint DJD (Hypertrophy, space narrowing, subchondral sclerosis, and/or osteophyte formation) 2. DDD and/or IVDD (Loss of disc height, desiccation, gas patterns, osteophytes, endplate sclerosis, or "Black disc disease") 3. Pars defects 4. Spondylolisthesis, spondylosis, and/or spondyloarthropathies (include Degree/Grade of displacement in mm) (stability) 5. Vertebral body Fractures (acute/chronic) (state percentage of collapse) 6. Demineralization (osteopenia/osteoporotic) 7. Bone pathology 8. Foraminal narrowing  9. Surgical changes    Standing Status:   Future    Standing Expiration Date:   02/13/2021    Scheduling Instructions:     Imaging must be done as soon as possible.  Inform patient that order will expire within 30 days and I will not renew it.    Order Specific Question:   Reason for Exam (SYMPTOM  OR DIAGNOSIS REQUIRED)    Answer:   Low back pain    Order Specific Question:   Preferred imaging location?    Answer:   [facility]    Order Specific Question:   Call Results- Best Contact Number?    Answer:   [phone] ([facility])    Order Specific Question:   Radiology Contrast Protocol - do NOT remove file path    Answer:   [email or web address]    Order Specific Question:   Release to patient    Answer:   Immediate   MR LUMBAR SPINE WO CONTRAST    Patient presents with axial pain with possible radicular component. Please assist [place] in identifying  specific level(s) and laterality of any additional findings such as: 1. Facet (Zygapophyseal) joint DJD (Hypertrophy, space narrowing, subchondral sclerosis, and/or osteophyte formation) 2. DDD and/or IVDD (Loss of disc height, desiccation, gas patterns, osteophytes, endplate sclerosis, or "Black disc disease") 3. Pars defects 4. Spondylolisthesis, spondylosis, and/or spondyloarthropathies (include Degree/Grade of displacement in mm) (stability) 5. Vertebral body Fractures (acute/chronic) (state percentage of collapse) 6. Demineralization (osteopenia/osteoporotic) 7. Bone pathology 8. Foraminal narrowing  9. Surgical changes 10. Central, Lateral Recess, and/or Foraminal Stenosis (include AP diameter of stenosis in mm) 11. Surgical changes (hardware type, status, and presence of fibrosis) 12. Modic Type Changes (MRI only) 13. IVDD (Disc bulge, protrusion, herniation, extrusion) (Level, laterality, extent)    Standing Status:   Future    Standing Expiration Date:   02/13/2021    Scheduling Instructions:     Imaging must be done as soon as possible. Inform patient that order will expire within 30 days and I will not renew it.    Order Specific Question:   What is the patient's sedation requirement?    Answer:   No Sedation    Order Specific Question:   Does the patient have a pacemaker or implanted devices?    Answer:   No    Order Specific Question:   Preferred imaging location?    Answer:   [facility] (table limit-350lbs)    Order Specific Question:   Call Results- Best Contact Number?    Answer:   [phone] ([facility])    Order Specific Question:   Radiology Contrast Protocol - do NOT remove file path    Answer:   \\[email or web address]    Follow-up plan:   Return for Eval-day (M,W), (F2F), for review of ordered tests (Xrays & MRI).     Interventional Therapies  Risk  Complexity Considerations:   Estimated body mass index is 23.44 kg/m  as calculated from the following:   Height as of this encounter: 5' (1.524 m).   Weight as of this encounter: 120 lb (54.4 kg). WNL   Planned  Pending:   Diagnostic/therapeutic left L5-S1 LESI #1    Under consideration:   Therapeutic left lumbar facet RFA #2  Palliative left lumbar facet MBB #3    Completed:   Diagnostic left lumbar facet MBB x2 (01/06/2019, 08/04/2019) (100/100/100/90-100)  Therapeutic left lumbar facet RFA x1 (12/27/2019) (100/100/100/90-100 x 3-4 months)    Therapeutic  Palliative (PRN) options:   Palliative left lumbar facet MBB  Palliative/therapeutic left lumbar facet RFA     Recent Visits No visits were found meeting these conditions. Showing recent visits within past 90 days and meeting  all other requirements Today's Visits Date Type Provider Dept  01/14/21 Office Visit Milinda Pointer, MD [facility]  Showing today's visits and meeting all other requirements Future Appointments No visits were found meeting these conditions. Showing future appointments within next 90 days and meeting all other requirements I discussed the assessment and treatment plan with the patient. The patient was provided an opportunity to ask questions and all were answered. The patient agreed with the plan and demonstrated an understanding of the instructions.  Patient advised to call back or seek an in-person evaluation if the symptoms or condition worsens.  Duration of encounter: 45 minutes.  Note by: Gaspar Cola, MD Date: 01/14/2021; Time: 8:55 AM

## 2021-01-14 ENCOUNTER — Other Ambulatory Visit: Payer: Self-pay

## 2021-01-14 ENCOUNTER — Encounter: Payer: Self-pay | Admitting: Pain Medicine

## 2021-01-14 ENCOUNTER — Ambulatory Visit: Payer: Medicare Other | Attending: Pain Medicine | Admitting: Pain Medicine

## 2021-01-14 VITALS — BP 115/76 | HR 102 | Temp 97.2°F | Resp 16 | Ht 60.0 in | Wt 120.0 lb

## 2021-01-14 DIAGNOSIS — M5136 Other intervertebral disc degeneration, lumbar region: Secondary | ICD-10-CM | POA: Diagnosis present

## 2021-01-14 DIAGNOSIS — M5417 Radiculopathy, lumbosacral region: Secondary | ICD-10-CM | POA: Diagnosis not present

## 2021-01-14 DIAGNOSIS — R29898 Other symptoms and signs involving the musculoskeletal system: Secondary | ICD-10-CM | POA: Insufficient documentation

## 2021-01-14 DIAGNOSIS — M79605 Pain in left leg: Secondary | ICD-10-CM | POA: Diagnosis not present

## 2021-01-14 DIAGNOSIS — Z23 Encounter for immunization: Secondary | ICD-10-CM | POA: Insufficient documentation

## 2021-01-14 DIAGNOSIS — M5442 Lumbago with sciatica, left side: Secondary | ICD-10-CM | POA: Diagnosis present

## 2021-01-14 DIAGNOSIS — M62838 Other muscle spasm: Secondary | ICD-10-CM | POA: Insufficient documentation

## 2021-01-14 DIAGNOSIS — G894 Chronic pain syndrome: Secondary | ICD-10-CM | POA: Diagnosis present

## 2021-01-14 DIAGNOSIS — M7918 Myalgia, other site: Secondary | ICD-10-CM | POA: Diagnosis present

## 2021-01-14 DIAGNOSIS — M79652 Pain in left thigh: Secondary | ICD-10-CM | POA: Insufficient documentation

## 2021-01-14 DIAGNOSIS — M5410 Radiculopathy, site unspecified: Secondary | ICD-10-CM | POA: Insufficient documentation

## 2021-01-14 DIAGNOSIS — M25552 Pain in left hip: Secondary | ICD-10-CM | POA: Insufficient documentation

## 2021-01-14 DIAGNOSIS — G8929 Other chronic pain: Secondary | ICD-10-CM | POA: Insufficient documentation

## 2021-01-14 DIAGNOSIS — IMO0002 Reserved for concepts with insufficient information to code with codable children: Secondary | ICD-10-CM | POA: Insufficient documentation

## 2021-01-14 DIAGNOSIS — M542 Cervicalgia: Secondary | ICD-10-CM | POA: Insufficient documentation

## 2021-01-14 DIAGNOSIS — R202 Paresthesia of skin: Secondary | ICD-10-CM | POA: Insufficient documentation

## 2021-01-14 DIAGNOSIS — M47816 Spondylosis without myelopathy or radiculopathy, lumbar region: Secondary | ICD-10-CM | POA: Diagnosis present

## 2021-01-14 MED ORDER — METHOCARBAMOL 500 MG PO TABS: 500.0000 mg | ORAL_TABLET | Freq: Three times a day (TID) | ORAL | 0 refills | Status: DC | PRN

## 2021-01-24 ENCOUNTER — Ambulatory Visit
Admission: RE | Admit: 2021-01-24 | Discharge: 2021-01-24 | Disposition: A | Discharge status: Home / Self Care | Payer: Medicare Other | Source: Ambulatory Visit | Attending: Pain Medicine | Admitting: Pain Medicine

## 2021-01-24 DIAGNOSIS — M79605 Pain in left leg: Secondary | ICD-10-CM | POA: Insufficient documentation

## 2021-01-24 DIAGNOSIS — M5417 Radiculopathy, lumbosacral region: Secondary | ICD-10-CM | POA: Diagnosis present

## 2021-01-24 DIAGNOSIS — M47816 Spondylosis without myelopathy or radiculopathy, lumbar region: Secondary | ICD-10-CM | POA: Insufficient documentation

## 2021-01-24 DIAGNOSIS — M5410 Radiculopathy, site unspecified: Secondary | ICD-10-CM | POA: Diagnosis present

## 2021-01-24 DIAGNOSIS — R29898 Other symptoms and signs involving the musculoskeletal system: Secondary | ICD-10-CM | POA: Insufficient documentation

## 2021-01-24 DIAGNOSIS — G8929 Other chronic pain: Secondary | ICD-10-CM | POA: Diagnosis present

## 2021-01-24 DIAGNOSIS — M5136 Other intervertebral disc degeneration, lumbar region: Secondary | ICD-10-CM | POA: Insufficient documentation

## 2021-01-24 DIAGNOSIS — R202 Paresthesia of skin: Secondary | ICD-10-CM | POA: Diagnosis present

## 2021-03-12 ENCOUNTER — Other Ambulatory Visit: Payer: Self-pay

## 2021-03-12 ENCOUNTER — Ambulatory Visit
Admission: RE | Admit: 2021-03-12 | Discharge: 2021-03-12 | Disposition: A | Discharge status: Home / Self Care | Payer: Medicare Other | Source: Ambulatory Visit | Attending: Family Medicine | Admitting: Family Medicine

## 2021-03-12 DIAGNOSIS — Z1231 Encounter for screening mammogram for malignant neoplasm of breast: Secondary | ICD-10-CM | POA: Insufficient documentation

## 2021-03-24 NOTE — Progress Notes (Signed)
PROVIDER NOTE: Information contained herein reflects review and annotations entered in association with encounter. Interpretation of such information and data should be left to medically-trained personnel. Information provided to patient can be located elsewhere in the medical record under "Patient Instructions". Document created using STT-dictation technology, any transcriptional errors that may result from process are unintentional.    Patient: Cynthia Mcpherson  Service Category: E/M  Provider: Gaspar Cola, MD  DOB: 12/03/1960  DOS: 03/27/2021  Specialty: Interventional Pain Management  MRN: [id number]  Setting: Ambulatory outpatient  PCP: Marval Regal, NP  Type: Established Patient    Referring Provider: Marval Regal, NP  Location: Office  Delivery: Face-to-face     HPI  Cynthia Mcpherson, a 61 y.o. year old female, is here today because of her Failed back surgical syndrome [M96.1]. Cynthia Mcpherson primary complain today is Back Pain (Lumbar left ), Leg Pain (Left thigh ), and Foot Pain (Left in the heel ) Last encounter: My last encounter with her was on 01/14/2021. Pertinent problems: Ms. 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); Lumbar facet arthropathy; Spondylosis without myelopathy or radiculopathy, lumbosacral region; DDD (degenerative disc disease), lumbosacral; Chronic pain syndrome; Chronic musculoskeletal pain; Breast pain, left; Neck sprain and strain; Spasm of muscle; Cervicalgia; Chronic lower extremity pain (2ry area of Pain) (Left); Chronic lower extremity radicular pain (Left); Lower extremity weakness (Left); Pins and needles sensation of foot (Left); Other intervertebral disc degeneration, lumbar region; Chronic thigh pain (3ry area of Pain) (Left); Chronic low back pain (Left) w/ sciatica (Left);  Abnormal MRI, lumbar spine (01/24/2021); Lumbar facet hypertrophy (L4-5, L5-S1) (Bilateral); and Grade 1 Retrolisthesis of lumbosacral spine (L5/S1) on their pertinent problem list. Pain Assessment: Severity of Chronic pain is reported as a 0-No pain/10. Location: Back (see visit info for additional pain sites.) Lower, Left/? left leg pain coming from back. Onset: More than a month ago. Quality: Discomfort, Constant, Stabbing, Burning, Tingling. Timing: Constant. Modifying factor(s): rest and ice pack.  medications. Vitals:  height is 5' (1.524 m) and weight is 119 lb (54 kg). Her temporal temperature is 98.1 F (36.7 C). Her blood pressure is 104/71 and her pulse is 84. Her respiration is 14 and oxygen saturation is 100%.   Reason for encounter: follow-up evaluation after lumbar MRI done on 01/24/2021.  Lumbar MRI reveals facet hypertrophy at L4-5 and L5-S1 with a possible grade 1 retrolisthesis of L5 over S1 and no foraminal or central stenosis.  Minimal degenerative changes.  She did not do the x-rays of the lumbar spine on flexion and extension, as I had ordered on 01/14/2021.  Today I had a long conversation regarding the results of the MRI and the possibility that the L5/S1 spondylolisthesis may have some degree of instability.  She refers that with certain movements she will experience lower extremity electrical-like sensations that will go all the way down to the top of her foot and toes.  She also describes having significant weakness in her legs where occasionally she will have difficulty standing up when she comes up from the sitting position.  At this point, based on her symptoms, we will go ahead and order a nerve conduction test of the lower extremity.  Even though in the chart it mentions that she has had 1 done before, there is no report and when asked today, she did not seem to  know how to get that.  She also indicated that if she had it done it was done a long time ago.  She refers that she  has worsened significantly since then.  In addition, today we will be ordering again there x-ray of the lumbar spine on flexion and extension, which I had ordered previously and apparently was not done.  Pharmacotherapy Assessment  Analgesic: No opioid analgesics prescribed by our practice.   Monitoring:  PMP: PDMP reviewed during this encounter.       Pharmacotherapy: No side-effects or adverse reactions reported. Compliance: No problems identified. Effectiveness: Clinically acceptable.  Janett Billow, RN  03/27/2021  1:52 PM  Sign when Signing Visit Safety precautions to be maintained throughout the outpatient stay will include: orient to surroundings, keep bed in low position, maintain call bell within reach at all times, provide assistance with transfer out of bed and ambulation.     UDS:  No results found for: SUMMARY   ROS  Constitutional: Denies any fever or chills Gastrointestinal: No reported hemesis, hematochezia, vomiting, or acute GI distress Musculoskeletal: Denies any acute onset joint swelling, redness, loss of ROM, or weakness Neurological: No reported episodes of acute onset apraxia, aphasia, dysarthria, agnosia, amnesia, paralysis, loss of coordination, or loss of consciousness  Medication Review  Levothyroxine Sodium, acetaminophen, cyclobenzaprine, ferrous sulfate, multivitamin with minerals, and omeprazole  History Review  Allergy: Ms. Alvira Mcpherson is allergic to influenza vaccines, iodinated contrast media, latex, shellfish allergy, and iodine. Drug: Ms. Alvira Mcpherson  reports no history of drug use. Alcohol:  reports that she does not currently use alcohol. Tobacco:  reports that she has never smoked. She has never used smokeless tobacco. Social: Ms. Alvira Mcpherson  reports that she has never smoked. She has never used smokeless tobacco. She reports that she does not currently use alcohol. She reports that she does not use drugs. Medical:  has a past medical history of GERD  (gastroesophageal reflux disease), Thrombocytosis, and Thyroid disease. Surgical: Ms. Alvira Mcpherson  has a past surgical history that includes Back surgery. Family: family history includes Hypertension in her brother; Multiple sclerosis in her sister; Psoriasis in her mother; Stroke in her sister.  Laboratory Chemistry Profile   Renal Lab Results  Component Value Date   BUN 17 01/02/2020   CREATININE 0.73 01/02/2020   GFR 89.13 01/02/2020   GFRAA >60 11/15/2019   GFRNONAA >60 11/15/2019    Hepatic Lab Results  Component Value Date   AST 30 01/02/2020   ALT 61 (H) 01/02/2020   ALBUMIN 4.7 01/02/2020   ALKPHOS 88 01/02/2020    Electrolytes Lab Results  Component Value Date   NA 138 01/02/2020   K 3.8 01/02/2020   CL 101 01/02/2020   CALCIUM 10.2 01/02/2020    Bone No results found for: VD25OH, VD125OH2TOT, FI4332RJ1, OA4166AY3, 25OHVITD1, 25OHVITD2, 25OHVITD3, TESTOFREE, TESTOSTERONE  Inflammation (CRP: Acute Phase) (ESR: Chronic Phase) Lab Results  Component Value Date   ESRSEDRATE 6 07/26/2019         Note: Above Lab results reviewed.  Recent Imaging Review  MM 3D SCREEN BREAST BILATERAL CLINICAL DATA:  Screening.  EXAM: DIGITAL SCREENING BILATERAL MAMMOGRAM WITH TOMOSYNTHESIS AND CAD  TECHNIQUE: Bilateral screening digital craniocaudal and mediolateral oblique mammograms were obtained. Bilateral screening digital breast tomosynthesis was performed. The images were evaluated with computer-aided detection.  COMPARISON:  Previous exam(s).  ACR Breast Density Category c: The breast tissue is heterogeneously dense, which may obscure small masses.  FINDINGS: There are no findings suspicious for  malignancy.  IMPRESSION: No mammographic evidence of malignancy. A result letter of this screening mammogram will be mailed directly to the patient.  RECOMMENDATION: Screening mammogram in one year. (Code:SM-B-01Y)  BI-RADS CATEGORY  1: Negative.  Electronically  Signed   By: Margarette Canada M.D.   On: 03/12/2021 15:48 Note: Reviewed        Physical Exam  General appearance: Well nourished, well developed, and well hydrated. In no apparent acute distress Mental status: Alert, oriented x 3 (person, place, & time)       Respiratory: No evidence of acute respiratory distress Eyes: PERLA Vitals: BP 104/71 (BP Location: Right Arm, Patient Position: Sitting, Cuff Size: Normal)    Pulse 84    Temp 98.1 F (36.7 C) (Temporal)    Resp 14    Ht 5' (1.524 m)    Wt 119 lb (54 kg)    SpO2 100%    BMI 23.24 kg/m  BMI: Estimated body mass index is 23.24 kg/m as calculated from the following:   Height as of this encounter: 5' (1.524 m).   Weight as of this encounter: 119 lb (54 kg). Ideal: Ideal body weight: 45.5 kg (100 lb 4.9 oz) Adjusted ideal body weight: 48.9 kg (107 lb 12.6 oz)  Assessment   Status Diagnosis  Controlled Controlled Controlled 1. 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 Chronic low back pain (1ry area of Pain) (Left) w/o sciatica   3. Lumbar facet syndrome (Left)   4. Lumbar facet hypertrophy (L4-5, L5-S1) (Bilateral)   5. Grade 1 Retrolisthesis of lumbosacral spine (L5/S1)   6. Chronic lower extremity pain (2ry area of Pain) (Left)   7. Chronic lower extremity radicular pain (Left)   8. Lumbar radiculitis (L4) (Left)   9. Lower extremity weakness (Left)   10. Chronic thigh pain (3ry area of Pain) (Left)   11. Chronic hip pain (Left)   12. 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  Ms. Raelin Pixler has a current medication list which includes the following long-term medication(s): cyclobenzaprine, ferrous sulfate, levothyroxine sodium, omeprazole, and tirosint.  Pharmacotherapy (Medications Ordered): No orders of the defined types were placed in this encounter.  Orders:  Orders Placed This Encounter  Procedures   DG Lumbar Spine Complete W/Bend    Patient presents with axial pain with possible radicular component. Please assist [place] in identifying specific level(s) and laterality of any additional findings such as: 1. Facet (Zygapophyseal) joint DJD (Hypertrophy, space narrowing, subchondral sclerosis, and/or osteophyte formation) 2. DDD and/or IVDD (Loss of disc height, desiccation, gas patterns, osteophytes, endplate sclerosis, or "Black disc disease") 3. Pars defects 4. Spondylolisthesis, spondylosis, and/or spondyloarthropathies (include Degree/Grade of displacement in mm) (stability) 5. Vertebral body Fractures (acute/chronic) (state percentage of collapse) 6. Demineralization (osteopenia/osteoporotic) 7. Bone pathology 8. Foraminal narrowing  9. Surgical changes    Standing Status:   Future    Standing Expiration Date:   04/23/2021    Scheduling Instructions:  Imaging must be done as soon as possible. Inform patient that order will expire within 30 days and I will not renew it.    Order Specific Question:   Reason for Exam (SYMPTOM  OR DIAGNOSIS REQUIRED)    Answer:   Low back pain    Order Specific Question:   Preferred imaging location?    Answer:   [facility]    Order Specific Question:   Call Results- Best Contact Number?    Answer:   [phone] ([facility])    Order Specific Question:   Radiology Contrast Protocol - do NOT remove file path    Answer:   [email or web address]     Order Specific Question:   Release to patient    Answer:   Immediate   NCV with EMG(electromyography)    Bilateral testing requested.    Standing Status:   Future    Standing Expiration Date:   05/25/2021    Scheduling Instructions:     Please refer this patient to [facility] for Nerve Conduction testing of the lower extremities. (EMG & PNCV)    Order Specific Question:   Where should this test be performed?    Answer:   other    Order Specific Question:   Release to patient    Answer:   Immediate   Follow-up plan:   Return for Eval-day (M,W), (F2F), for review of ordered tests.     Interventional Therapies  Risk   Complexity Considerations:   Estimated body mass index is 23.44 kg/m as calculated from the following:   Height as of this encounter: 5' (1.524 m).   Weight as of this encounter: 120 lb (54.4 kg). WNL   Planned   Pending:   Diagnostic/therapeutic left L5-S1 LESI #1    Under consideration:   Therapeutic left lumbar facet RFA #2  Palliative left lumbar facet MBB #3    Completed:   Diagnostic left lumbar facet MBB x2 (01/06/2019, 08/04/2019) (100/100/100/90-100)  Therapeutic left lumbar facet RFA x1 (12/27/2019) (100/100/100/90-100 x 3-4 months)    Completed by other providers:   Diagnostic/therapeutic LESI (2009) by [facility] left SI joint injection under fluoroscopic guidance x1 (07/01/2017) by Dr. Cira Servant ([facility])  Laminectomy (05/01/2016)    Therapeutic   Palliative (PRN) options:   Palliative left lumbar facet MBB  Palliative/therapeutic left lumbar facet RFA     Recent Visits Date Type Provider Dept  01/14/21 Office Visit Milinda Pointer, MD [facility]  Showing recent visits within past 90 days and meeting all other requirements Today's Visits Date Type Provider Dept  03/27/21 Office Visit Milinda Pointer, MD [facility]  Showing today's visits and meeting all other requirements Future  Appointments No visits were found meeting these conditions. Showing future appointments within next 90 days and meeting all other requirements  I discussed the assessment and treatment plan with the patient. The patient was provided an opportunity to ask questions and all were answered. The patient agreed with the plan and demonstrated an understanding of the instructions.  Patient advised to call back or seek an in-person evaluation if the symptoms or condition worsens.  Duration of encounter: 30 minutes.  Note by: Gaspar Cola, MD Date: 03/27/2021; Time: 2:21 PM

## 2021-03-26 DIAGNOSIS — M4310 Spondylolisthesis, site unspecified: Secondary | ICD-10-CM | POA: Insufficient documentation

## 2021-03-26 DIAGNOSIS — R937 Abnormal findings on diagnostic imaging of other parts of musculoskeletal system: Secondary | ICD-10-CM | POA: Insufficient documentation

## 2021-03-26 DIAGNOSIS — M47816 Spondylosis without myelopathy or radiculopathy, lumbar region: Secondary | ICD-10-CM | POA: Insufficient documentation

## 2021-03-27 ENCOUNTER — Other Ambulatory Visit: Payer: Self-pay

## 2021-03-27 ENCOUNTER — Ambulatory Visit
Admission: RE | Admit: 2021-03-27 | Discharge: 2021-03-27 | Disposition: A | Discharge status: Home / Self Care | Payer: Medicare Other | Source: Ambulatory Visit | Attending: Pain Medicine | Admitting: Pain Medicine

## 2021-03-27 ENCOUNTER — Ambulatory Visit (HOSPITAL_BASED_OUTPATIENT_CLINIC_OR_DEPARTMENT_OTHER): Payer: Medicare Other | Admitting: Pain Medicine

## 2021-03-27 ENCOUNTER — Encounter: Payer: Self-pay | Admitting: Pain Medicine

## 2021-03-27 VITALS — BP 104/71 | HR 84 | Temp 98.1°F | Resp 14 | Ht 60.0 in | Wt 119.0 lb

## 2021-03-27 DIAGNOSIS — M79605 Pain in left leg: Secondary | ICD-10-CM | POA: Insufficient documentation

## 2021-03-27 DIAGNOSIS — M4310 Spondylolisthesis, site unspecified: Secondary | ICD-10-CM | POA: Insufficient documentation

## 2021-03-27 DIAGNOSIS — G8929 Other chronic pain: Secondary | ICD-10-CM | POA: Insufficient documentation

## 2021-03-27 DIAGNOSIS — M5416 Radiculopathy, lumbar region: Secondary | ICD-10-CM | POA: Insufficient documentation

## 2021-03-27 DIAGNOSIS — M545 Low back pain, unspecified: Secondary | ICD-10-CM

## 2021-03-27 DIAGNOSIS — R937 Abnormal findings on diagnostic imaging of other parts of musculoskeletal system: Secondary | ICD-10-CM | POA: Diagnosis not present

## 2021-03-27 DIAGNOSIS — M25552 Pain in left hip: Secondary | ICD-10-CM | POA: Insufficient documentation

## 2021-03-27 DIAGNOSIS — R29898 Other symptoms and signs involving the musculoskeletal system: Secondary | ICD-10-CM

## 2021-03-27 DIAGNOSIS — M961 Postlaminectomy syndrome, not elsewhere classified: Secondary | ICD-10-CM

## 2021-03-27 DIAGNOSIS — M47816 Spondylosis without myelopathy or radiculopathy, lumbar region: Secondary | ICD-10-CM | POA: Insufficient documentation

## 2021-03-27 DIAGNOSIS — M79652 Pain in left thigh: Secondary | ICD-10-CM | POA: Insufficient documentation

## 2021-03-27 DIAGNOSIS — M5410 Radiculopathy, site unspecified: Secondary | ICD-10-CM

## 2021-03-27 NOTE — Progress Notes (Signed)
Safety precautions to be maintained throughout the outpatient stay will include: orient to surroundings, keep bed in low position, maintain call bell within reach at all times, provide assistance with transfer out of bed and ambulation.  

## 2021-04-10 ENCOUNTER — Ambulatory Visit: Payer: Medicare Other | Admitting: Pain Medicine

## 2021-05-16 NOTE — Progress Notes (Signed)
PROVIDER NOTE: Information contained herein reflects review and annotations entered in association with encounter. Interpretation of such information and data should be left to medically-trained personnel. Information provided to patient can be located elsewhere in the medical record under "Patient Instructions". Document created using STT-dictation technology, any transcriptional errors that may result from process are unintentional.  ?  ?Patient: Cynthia Mcpherson  Service Category: E/M  Provider: Gaspar Cola, MD  ?DOB: 1960/09/28  DOS: 05/20/2021  Specialty: Interventional Pain Management  ?MRN: [id number]  Setting: Ambulatory outpatient  PCP: Marval Regal, NP  ?Type: Established Patient    Referring Provider: Marval Regal, NP  ?Location: Office  Delivery: Face-to-face    ? ?HPI  ?[name], a 61 y.o. year old female, is here today because of her Chronic pain syndrome [G89.4]. [name]'s primary complain today is Back Pain (lower) ?Last encounter: My last encounter with her was on 03/27/2021. ?Pertinent problems: 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); Lumbar facet arthropathy; Spondylosis without myelopathy or radiculopathy, lumbosacral region; DDD (degenerative disc disease), lumbosacral; Chronic pain syndrome; Chronic musculoskeletal pain; Breast pain, left; Neck sprain and strain; Spasm of muscle; Cervicalgia; Chronic lower extremity pain (2ry area of Pain) (Left); Chronic lower extremity radicular pain (Left); Lower extremity weakness (Left); Pins and needles sensation of foot (Left); Other intervertebral disc degeneration, lumbar region; Chronic thigh pain (3ry area of Pain) (Left); Chronic low back pain (Left) w/ sciatica (Left); Abnormal MRI, lumbar spine (01/24/2021); Lumbar facet hypertrophy  (L4-5, L5-S1) (Bilateral); and Grade 1 Retrolisthesis of lumbosacral spine (L5/S1) on their pertinent problem list. ?Pain Assessment: Severity of Chronic pain is reported as a 7 /10. Location: Back Left, Lower/pain radiaties down left side, buttock, leg down to her ankle. Onset: 1 to 4 weeks ago. Quality: Aching, Constant, Stabbing, Burning, Tingling. Timing: Constant. Modifying factor(s): rest and ice pack. ?Vitals:  height is 5' (1.524 m) and weight is 119 lb (54 kg). Her temperature is 97.3 ?F (36.3 ?C) (abnormal). Her blood pressure is 104/76 and her pulse is 93. Her respiration is 5 (abnormal) and oxygen saturation is 100%.  ? ?Reason for encounter: follow-up evaluation after EMG/PNCV.  According to the schedule, the patient was supposed to have had the EMG/PNCV done on March 22 at 2 PM.  Initial precharting surgery of the nerve conduction test yielded no results as of today. ? ?Pharmacotherapy Assessment  ?Analgesic: No opioid analgesics prescribed by our practice.  ? ?Monitoring: ?[place] PMP: PDMP reviewed during this encounter.       ?Pharmacotherapy: No side-effects or adverse reactions reported. ?Compliance: No problems identified. ?Effectiveness: Clinically acceptable. ? ?Cynthia Fischer, RN  05/20/2021 11:13 AM  Sign when Signing Visit ?Safety precautions to be maintained throughout the outpatient stay will include: orient to surroundings, keep bed in low position, maintain call bell within reach at all times, provide assistance with transfer out of bed and ambulation.  ?   UDS:  ?No results found for: SUMMARY  ? ?ROS  ?Constitutional: Denies any fever or chills ?Gastrointestinal: No reported hemesis, hematochezia, vomiting, or acute GI distress ?Musculoskeletal: Denies any acute onset joint swelling, redness, loss of ROM, or weakness ?Neurological: No reported episodes of acute onset apraxia, aphasia, dysarthria, agnosia, amnesia, paralysis, loss of coordination, or loss of consciousness ? ?Medication Review   ?Levothyroxine Sodium, acetaminophen,  cyclobenzaprine, ferrous sulfate, multivitamin with minerals, and omeprazole ? ?History Review  ?Allergy: [name] is allergic to influenza vaccines, iodinated contrast media, latex, shellfish allergy, and iodine. ?Drug: [name]  reports no history of drug use. ?Alcohol:  reports that she does not currently use alcohol. ?Tobacco:  reports that she has never smoked. She has never used smokeless tobacco. ?Social: [name]  reports that she has never smoked. She has never used smokeless tobacco. She reports that she does not currently use alcohol. She reports that she does not use drugs. ?Medical:  has a past medical history of GERD (gastroesophageal reflux disease), Thrombocytosis, and Thyroid disease. ?Surgical: [name]  has a past surgical history that includes Back surgery. ?Family: family history includes Hypertension in her brother; Multiple sclerosis in her sister; Psoriasis in her mother; Stroke in her sister. ? ?Laboratory Chemistry Profile  ? ?Renal ?Lab Results  ?Component Value Date  ? BUN 17 01/02/2020  ? CREATININE 0.73 01/02/2020  ? GFR 89.13 01/02/2020  ? GFRAA >60 11/15/2019  ? GFRNONAA >60 11/15/2019  ?  Hepatic ?Lab Results  ?Component Value Date  ? AST 30 01/02/2020  ? ALT 61 (H) 01/02/2020  ? ALBUMIN 4.7 01/02/2020  ? ALKPHOS 88 01/02/2020  ?  ?Electrolytes ?Lab Results  ?Component Value Date  ? NA 138 01/02/2020  ? K 3.8 01/02/2020  ? CL 101 01/02/2020  ? CALCIUM 10.2 01/02/2020  ?  Bone ?No results found for: [place], [id number], [id number], [id number], 25OHVITD1, 25OHVITD2, 25OHVITD3, TESTOFREE, TESTOSTERONE  ?Inflammation (CRP: Acute Phase) (ESR: Chronic Phase) ?Lab Results  ?Component Value Date  ? ESRSEDRATE 6 07/26/2019  ?    ?  ? ?Note: Above Lab results reviewed. ? ?Recent Imaging Review  ?DG Lumbar Spine Complete W/Bend ?CLINICAL DATA:  Lumbar radiculopathy of L4. Chronic lower back and ?hip pain. ? ?EXAM: ?LUMBAR  SPINE - COMPLETE WITH BENDING VIEWS ? ?COMPARISON:  MR lumbar 01/24/2021. ? ?FINDINGS: ?Lumbar spine numbered the lowest segmented appearing lumbar shaped ?vertebra on lateral view as L5. Mild lumbar scoliosis concave left. ?Diffuse mild multilevel degenerative change. Disc degeneration most ?prominent L5-S1. No flexion or extension abnormality identified. No ?acute bony abnormality identified. Paraspinal soft tissues are ?unremarkable. Two prominent pelvic calcific densities are noted. ?These could represent prominent phleboliths, undigested pill ?fragments, and or bladder stones. ? ?IMPRESSION: ?1. Mild lumbar scoliosis concave left. Diffuse mild multilevel ?degenerative change. Disc degeneration most prominent L5-S1. No ?flexion or extension abnormality. No acute bony abnormality ?identified. ? ?2. Two prominent pelvic calcific densities are noted. These could ?represent prominent phleboliths, undigested pill fragments, and or ?bladder stones. ? ?Electronically Signed ?  By: Marcello Moores  Register M.D. ?  On: 03/29/2021 07:43 ?Note: Reviewed       ? ?Physical Exam  ?General appearance: Well nourished, well developed, and well hydrated. In no apparent acute distress ?Mental status: Alert, oriented x 3 (person, place, & time)       ?Respiratory: No evidence of acute respiratory distress ?Eyes: PERLA ?Vitals: BP 104/76   Pulse 93   Temp (!) 97.3 ?F (36.3 ?C)   Resp (!) 5   Ht 5' (1.524 m)   Wt 119 lb (54 kg)   SpO2 100%   BMI 23.24 kg/m?  ?BMI: Estimated body mass index is 23.24 kg/m? as calculated from the following: ?  Height as of this encounter: 5' (1.524 m). ?  Weight as of this encounter: 119 lb (54 kg). ?Ideal: Ideal body weight: 45.5  kg (100 lb 4.9 oz) ?Adjusted ideal body weight: 48.9 kg (107 lb 12.6 oz) ? ?Assessment  ? ?Diagnosis Status  ?1. Chronic pain syndrome   ?2. Chronic low back pain (1ry area of Pain) (Left) w/o sciatica   ?3. Chronic lower extremity pain (2ry area of Pain) (Left)   ?4. Chronic  lower extremity radicular pain (Left)   ?5. DDD (degenerative disc disease), lumbosacral   ?6. 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 Grade 1 Retrolisthesis of lumbosacral spine (L5/S1)   ?8. Lumbar r

## 2021-05-20 ENCOUNTER — Ambulatory Visit: Payer: Medicare Other | Attending: Pain Medicine | Admitting: Pain Medicine

## 2021-05-20 ENCOUNTER — Encounter: Payer: Self-pay | Admitting: Pain Medicine

## 2021-05-20 VITALS — BP 104/76 | HR 93 | Temp 97.3°F | Resp 5 | Ht 60.0 in | Wt 119.0 lb

## 2021-05-20 DIAGNOSIS — M5137 Other intervertebral disc degeneration, lumbosacral region: Secondary | ICD-10-CM | POA: Insufficient documentation

## 2021-05-20 DIAGNOSIS — M4310 Spondylolisthesis, site unspecified: Secondary | ICD-10-CM | POA: Diagnosis present

## 2021-05-20 DIAGNOSIS — M5410 Radiculopathy, site unspecified: Secondary | ICD-10-CM | POA: Insufficient documentation

## 2021-05-20 DIAGNOSIS — G894 Chronic pain syndrome: Secondary | ICD-10-CM | POA: Insufficient documentation

## 2021-05-20 DIAGNOSIS — G8929 Other chronic pain: Secondary | ICD-10-CM | POA: Insufficient documentation

## 2021-05-20 DIAGNOSIS — M79605 Pain in left leg: Secondary | ICD-10-CM | POA: Diagnosis not present

## 2021-05-20 DIAGNOSIS — M545 Low back pain, unspecified: Secondary | ICD-10-CM | POA: Insufficient documentation

## 2021-05-20 DIAGNOSIS — M961 Postlaminectomy syndrome, not elsewhere classified: Secondary | ICD-10-CM | POA: Insufficient documentation

## 2021-05-20 DIAGNOSIS — M47816 Spondylosis without myelopathy or radiculopathy, lumbar region: Secondary | ICD-10-CM | POA: Diagnosis present

## 2021-05-20 DIAGNOSIS — R937 Abnormal findings on diagnostic imaging of other parts of musculoskeletal system: Secondary | ICD-10-CM | POA: Insufficient documentation

## 2021-05-20 DIAGNOSIS — M5416 Radiculopathy, lumbar region: Secondary | ICD-10-CM | POA: Insufficient documentation

## 2021-05-20 NOTE — Progress Notes (Signed)
Safety precautions to be maintained throughout the outpatient stay will include: orient to surroundings, keep bed in low position, maintain call bell within reach at all times, provide assistance with transfer out of bed and ambulation.  

## 2021-05-20 NOTE — Patient Instructions (Addendum)
______________________________________________________________________ ? ?Preparing for Procedure with Sedation ? ?NOTICE: Due to recent regulatory changes, starting on September 17, 2020, procedures requiring intravenous (IV) sedation will no longer be performed at the [facility].  These types of procedures are required to be performed at [facility] ambulatory surgery facility.  We are very sorry for the inconvenience. ? ?Procedure appointments are limited to planned procedures: ?No Prescription Refills. ?No disability issues will be discussed. ?No medication changes will be discussed. ? ?Instructions: ?Oral Intake: Do not eat or drink anything for at least 8 hours prior to your procedure. (Exception: Blood Pressure Medication. See below.) ?Transportation: A driver is required. You may not drive yourself after the procedure. ?Blood Pressure Medicine: Do not forget to take your blood pressure medicine with a sip of water the morning of the procedure. If your Diastolic (lower reading) is above 100 mmHg, elective cases will be cancelled/rescheduled. ?Blood thinners: These will need to be stopped for procedures. Notify our staff if you are taking any blood thinners. Depending on which one you take, there will be specific instructions on how and when to stop it. ?Diabetics on insulin: Notify the staff so that you can be scheduled 1st case in the morning. If your diabetes requires high dose insulin, take only ? of your normal insulin dose the morning of the procedure and notify the staff that you have done so. ?Preventing infections: Shower with an antibacterial soap the morning of your procedure. ?Build-up your immune system: Take 1000 mg of Vitamin C with every meal (3 times a day) the day prior to your procedure. ?Antibiotics: Inform the staff if you have a condition or reason that requires you to take antibiotics before dental procedures. ?Pregnancy: If you are pregnant, call and cancel the procedure. ?Sickness: If  you have a cold, fever, or any active infections, call and cancel the procedure. ?Arrival: You must be in the facility at least 30 minutes prior to your scheduled procedure. ?Children: Do not bring children with you. ?Dress appropriately: Bring dark clothing that you would not mind if they get stained. ?Valuables: Do not bring any jewelry or valuables. ? ?Reasons to call and reschedule or cancel your procedure: (Following these recommendations will minimize the risk of a serious complication.) ?Surgeries: Avoid having procedures within 2 weeks of any surgery. (Avoid for 2 weeks before or after any surgery). ?Flu Shots: Avoid having procedures within 2 weeks of a flu shots. (Avoid for 2 weeks before or after immunizations). ?Barium: Avoid having a procedure within 7-10 days after having had a radiological study involving the use of radiological contrast. (Myelograms, Barium swallow or enema study). ?Heart attacks: Avoid any elective procedures or surgeries for the initial 6 months after a "Myocardial Infarction" (Heart Attack). ?Blood thinners: It is imperative that you stop these medications before procedures. Let us know if you if you take any blood thinner.  ?Infection: Avoid procedures during or within two weeks of an infection (including chest colds or gastrointestinal problems). Symptoms associated with infections include: Localized redness, fever, chills, night sweats or profuse sweating, burning sensation when voiding, cough, congestion, stuffiness, runny nose, sore throat, diarrhea, nausea, vomiting, cold or Flu symptoms, recent or current infections. It is specially important if the infection is over the area that we intend to treat. ?Heart and lung problems: Symptoms that may suggest an active cardiopulmonary problem include: cough, chest pain, breathing difficulties or shortness of breath, dizziness, ankle swelling, uncontrolled high or unusually low blood pressure, and/or palpitations. If you are    experiencing any of these symptoms, cancel your procedure and contact your primary care physician for an evaluation. ? ?Remember:  ?Regular Business hours are:  ?Monday to Thursday 8:00 AM to 4:00 PM ? ?Provider's Schedule: ?Francisco Naveira, MD:  ?Procedure days: Tuesday and Thursday 7:30 AM to 4:00 PM ? ?Bilal Lateef, MD:  ?Procedure days: Monday and Wednesday 7:30 AM to 4:00 PM ?______________________________________________________________________ ? ____________________________________________________________________________________________ ? ?General Risks and Possible Complications ? ?Patient Responsibilities: It is important that you read this as it is part of your informed consent. It is our duty to inform you of the risks and possible complications associated with treatments offered to you. It is your responsibility as a patient to read this and to ask questions about anything that is not clear or that you believe was not covered in this document. ? ?Patient?s Rights: You have the right to refuse treatment. You also have the right to change your mind, even after initially having agreed to have the treatment done. However, under this last option, if you wait until the last second to change your mind, you may be charged for the materials used up to that point. ? ?Introduction: Medicine is not an exact science. Everything in Medicine, including the lack of treatment(s), carries the potential for danger, harm, or loss (which is by definition: Risk). In Medicine, a complication is a secondary problem, condition, or disease that can aggravate an already existing one. All treatments carry the risk of possible complications. The fact that a side effects or complications occurs, does not imply that the treatment was conducted incorrectly. It must be clearly understood that these can happen even when everything is done following the highest safety standards. ? ?No treatment: You can choose not to proceed with the  proposed treatment alternative. The ?PRO(s)? would include: avoiding the risk of complications associated with the therapy. The ?CON(s)? would include: not getting any of the treatment benefits. These benefits fall under one of three categories: diagnostic; therapeutic; and/or palliative. Diagnostic benefits include: getting information which can ultimately lead to improvement of the disease or symptom(s). Therapeutic benefits are those associated with the successful treatment of the disease. Finally, palliative benefits are those related to the decrease of the primary symptoms, without necessarily curing the condition (example: decreasing the pain from a flare-up of a chronic condition, such as incurable terminal cancer). ? ?General Risks and Complications: These are associated to most interventional treatments. They can occur alone, or in combination. They fall under one of the following six (6) categories: no benefit or worsening of symptoms; bleeding; infection; nerve damage; allergic reactions; and/or death. ?No benefits or worsening of symptoms: In Medicine there are no guarantees, only probabilities. No healthcare provider can ever guarantee that a medical treatment will work, they can only state the probability that it may. Furthermore, there is always the possibility that the condition may worsen, either directly, or indirectly, as a consequence of the treatment. ?Bleeding: This is more common if the patient is taking a blood thinner, either prescription or over the counter (example: Goody Powders, Fish oil, Aspirin, Garlic, etc.), or if suffering a condition associated with impaired coagulation (example: Hemophilia, cirrhosis of the liver, low platelet counts, etc.). However, even if you do not have one on these, it can still happen. If you have any of these conditions, or take one of these drugs, make sure to notify your treating physician. ?Infection: This is more common in patients with a compromised  immune system, either due to disease (example:   diabetes, cancer, human immunodeficiency virus [HIV], etc.), or due to medications or treatments (example: therapies used to treat cancer and rheumatological diseas

## 2021-06-04 ENCOUNTER — Other Ambulatory Visit: Payer: Medicare Other

## 2021-06-04 ENCOUNTER — Ambulatory Visit: Payer: Medicare Other | Admitting: Oncology

## 2021-06-30 ENCOUNTER — Other Ambulatory Visit: Payer: Self-pay | Admitting: *Deleted

## 2021-06-30 DIAGNOSIS — D75839 Thrombocytosis, unspecified: Secondary | ICD-10-CM

## 2021-07-01 ENCOUNTER — Inpatient Hospital Stay: Payer: Medicare Other | Attending: Oncology

## 2021-07-01 ENCOUNTER — Inpatient Hospital Stay (HOSPITAL_BASED_OUTPATIENT_CLINIC_OR_DEPARTMENT_OTHER): Payer: Medicare Other | Admitting: Oncology

## 2021-07-01 ENCOUNTER — Encounter: Payer: Self-pay | Admitting: Oncology

## 2021-07-01 VITALS — BP 104/67 | HR 80 | Temp 96.4°F | Resp 16 | Wt 119.3 lb

## 2021-07-01 DIAGNOSIS — E039 Hypothyroidism, unspecified: Secondary | ICD-10-CM | POA: Insufficient documentation

## 2021-07-01 DIAGNOSIS — Z79899 Other long term (current) drug therapy: Secondary | ICD-10-CM | POA: Diagnosis not present

## 2021-07-01 DIAGNOSIS — D75839 Thrombocytosis, unspecified: Secondary | ICD-10-CM

## 2021-07-01 LAB — CBC WITH DIFFERENTIAL/PLATELET
Abs Immature Granulocytes: 0.01 10*3/uL (ref 0.00–0.07)
Basophils Absolute: 0 10*3/uL (ref 0.0–0.1)
Basophils Relative: 1 %
Eosinophils Absolute: 0.1 10*3/uL (ref 0.0–0.5)
Eosinophils Relative: 1 %
HCT: 41.9 % (ref 36.0–46.0)
Hemoglobin: 13.7 g/dL (ref 12.0–15.0)
Immature Granulocytes: 0 %
Lymphocytes Relative: 21 %
Lymphs Abs: 1.6 10*3/uL (ref 0.7–4.0)
MCH: 28.5 pg (ref 26.0–34.0)
MCHC: 32.7 g/dL (ref 30.0–36.0)
MCV: 87.1 fL (ref 80.0–100.0)
Monocytes Absolute: 0.5 10*3/uL (ref 0.1–1.0)
Monocytes Relative: 6 %
Neutro Abs: 5.3 10*3/uL (ref 1.7–7.7)
Neutrophils Relative %: 71 %
Platelets: 356 10*3/uL (ref 150–400)
RBC: 4.81 MIL/uL (ref 3.87–5.11)
RDW: 13.9 % (ref 11.5–15.5)
WBC: 7.5 10*3/uL (ref 4.0–10.5)
nRBC: 0 % (ref 0.0–0.2)

## 2021-07-04 NOTE — Progress Notes (Signed)
Hematology/Oncology Consult note [facility]  [phone] [phone]  Patient Care Team: Marval Regal, NP as PCP - General (Nurse Practitioner)   Name of the patient: Cynthia Mcpherson  [id number]  02/08/1961   Date of visit: 07/04/21  Diagnosis-thrombocytosis likely reactive  Chief complaint/ Reason for visit-routine follow-up of thrombocytosis  Heme/Onc history: Patient is a 60 year old female with a past medical history significant for hypothyroidism.  She has been referred to [place] for thrombocytosis.  Most recent labs from 06/23/2019 showed white count of 8, H&H of 13.3/40 with an MCV of 83 and a platelet count of 508.  Prior to that her platelet count was 474 in September 2020.  Patient currently feels well and has occasional back pain and fatigue but denies other complaints.  Denies any prior history of thromboembolic events heart attacks or TIAs.  She is currently not taking any blood thinner.   Results of blood work from 07/26/2019 were as follows: CBC showed white count of 7.3, H&H of 13.7/41.8 and a platelet count of 432.  Ferritin was low normal at 23 and iron studies were normal.  JAK2, CAL R, MPL mutation was negative.  Smear review unremarkable.  ESR normal.  CMP normal except for mildly elevated ast and alt of 48 and 72 respectively  Interval history-he is doing well overall.  She does follow-up with pain management for her chronic low back pain  ECOG PS- 0 Pain scale- 0   Review of systems- Review of Systems  Constitutional:  Positive for malaise/fatigue. Negative for chills, fever and weight loss.  HENT:  Negative for congestion, ear discharge and nosebleeds.   Eyes:  Negative for blurred vision.  Respiratory:  Negative for cough, hemoptysis, sputum production, shortness of breath and wheezing.   Cardiovascular:  Negative for chest pain, palpitations, orthopnea and claudication.  Gastrointestinal:  Negative for  abdominal pain, blood in stool, constipation, diarrhea, heartburn, melena, nausea and vomiting.  Genitourinary:  Negative for dysuria, flank pain, frequency, hematuria and urgency.  Musculoskeletal:  Positive for back pain. Negative for joint pain and myalgias.  Skin:  Negative for rash.  Neurological:  Negative for dizziness, tingling, focal weakness, seizures, weakness and headaches.  Endo/Heme/Allergies:  Does not bruise/bleed easily.  Psychiatric/Behavioral:  Negative for depression and suicidal ideas. The patient does not have insomnia.       Allergies  Allergen Reactions   Influenza Vaccines Hives and Rash   Iodinated Contrast Media     Rash and breathing problems   Latex Hives and Rash   Shellfish Allergy Hives and Rash   Iodine Itching and Rash     Past Medical History:  Diagnosis Date   GERD (gastroesophageal reflux disease)    Thrombocytosis    Thyroid disease    hypothyroidism      Past Surgical History:  Procedure Laterality Date   BACK SURGERY      Social History   Socioeconomic History   Marital status: Married    Spouse name: Not on file   Number of children: Not on file   Years of education: Not on file   Highest education level: Not on file  Occupational History   Not on file  Tobacco Use   Smoking status: Never   Smokeless tobacco: Never  Substance and Sexual Activity   Alcohol use: Not Currently   Drug use: Never   Sexual activity: Yes  Other Topics Concern   Not on file  Social History  Narrative   Married lives with her husband, has 1 grown daughter.  Retired Marine scientist.  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    Social Determinants of Health   Financial Resource Strain: Not on file  Food Insecurity: Not on file  Transportation Needs: Not on file  Physical Activity: Not on file  Stress: Not on file  Social Connections: Not on file  Intimate Partner Violence: Not on file    Family History  Problem Relation Age of Onset   Psoriasis Mother     Multiple sclerosis Sister    Stroke Sister    Hypertension Brother    Breast cancer Neg Hx      Current Outpatient Medications:    acetaminophen (TYLENOL) 500 MG tablet, Take 500 mg by mouth every 6 (six) hours as needed., Disp: , Rfl:    cyclobenzaprine (FLEXERIL) 10 MG tablet, Take 1 tablet (10 mg total) by mouth 3 (three) times daily as needed for muscle spasms., Disp: 90 tablet, Rfl: 2   ferrous sulfate 325 (65 FE) MG tablet, Take 325 mg by mouth daily with breakfast., Disp: , Rfl:    Multiple Vitamins-Minerals (MULTIVITAMIN WITH MINERALS) tablet, Take 1 tablet by mouth daily., Disp: , Rfl:    omeprazole (PRILOSEC) 20 MG capsule, Take 20 mg by mouth daily as needed. , Disp: , Rfl:    rosuvastatin (CRESTOR) 5 MG tablet, Take 5 mg by mouth daily., Disp: , Rfl:    TIROSINT 100 MCG CAPS, Take 1 capsule by mouth daily., Disp: , Rfl:    Levothyroxine Sodium 88 MCG CAPS, Take 88 mcg by mouth daily. (Patient not taking: Reported on 07/01/2021), Disp: , Rfl:   Physical exam:  Vitals:   07/01/21 1333  BP: 104/67  Pulse: 80  Resp: 16  Temp: (!) 96.4 F (35.8 C)  SpO2: 99%  Weight: 119 lb 4.8 oz (54.1 kg)   Physical Exam Constitutional:      General: She is not in acute distress. Cardiovascular:     Rate and Rhythm: Normal rate and regular rhythm.     Heart sounds: Normal heart sounds.  Pulmonary:     Effort: Pulmonary effort is normal.     Breath sounds: Normal breath sounds.  Abdominal:     General: Bowel sounds are normal.     Palpations: Abdomen is soft.  Skin:    General: Skin is warm and dry.  Neurological:     Mental Status: She is alert and oriented to person, place, and time.        Latest Ref Rng & Units 01/02/2020    4:03 PM  CMP  Glucose 70 - 99 mg/dL 86    BUN 6 - 23 mg/dL 17    Creatinine 0.40 - 1.20 mg/dL 0.73    Sodium 135 - 145 mEq/L 138    Potassium 3.5 - 5.1 mEq/L 3.8    Chloride 96 - 112 mEq/L 101    CO2 19 - 32 mEq/L 29    Calcium 8.4 - 10.5 mg/dL  10.2    Total Protein 6.0 - 8.3 g/dL 7.5    Total Bilirubin 0.2 - 1.2 mg/dL 0.3    Alkaline Phos 39 - 117 U/L 88    AST 0 - 37 U/L 30    ALT 0 - 35 U/L 61        Latest Ref Rng & Units 07/01/2021    1:15 PM  CBC  WBC 4.0 - 10.5 K/uL 7.5    Hemoglobin 12.0 -  15.0 g/dL 13.7    Hematocrit 36.0 - 46.0 % 41.9    Platelets 150 - 400 K/uL 356       Assessment and plan- Patient is a 61 y.o. female Here for routine follow-up of thrombocytosis   Patient's platelet count is currently normal at 356.  She has had mildly elevated platelet counts in the 400s since 2020.  However the last 2 platelet counts have been within normal limits.  Prior evaluation for myeloproliferative neoplasm has been negative.  No consistent increase in her platelet count trends.  I suspect this is reactive and can be monitored every 6 months to 1 year by her PCP.  She does not require follow-up with me at this time   Visit Diagnosis 1. Thrombocytosis      Dr. Randa Evens, MD, MPH [facility] [phone] 07/04/2021 10:58 AM

## 2021-09-12 ENCOUNTER — Other Ambulatory Visit: Payer: Self-pay | Admitting: Orthopedic Surgery

## 2021-09-12 DIAGNOSIS — M25411 Effusion, right shoulder: Secondary | ICD-10-CM

## 2021-09-12 DIAGNOSIS — M25511 Pain in right shoulder: Secondary | ICD-10-CM

## 2021-09-12 DIAGNOSIS — R29898 Other symptoms and signs involving the musculoskeletal system: Secondary | ICD-10-CM

## 2021-09-12 DIAGNOSIS — M75101 Unspecified rotator cuff tear or rupture of right shoulder, not specified as traumatic: Secondary | ICD-10-CM

## 2021-09-19 ENCOUNTER — Ambulatory Visit
Admission: RE | Admit: 2021-09-19 | Discharge: 2021-09-19 | Disposition: A | Discharge status: Home / Self Care | Payer: Medicare Other | Source: Ambulatory Visit | Attending: Orthopedic Surgery | Admitting: Orthopedic Surgery

## 2021-09-19 DIAGNOSIS — M75101 Unspecified rotator cuff tear or rupture of right shoulder, not specified as traumatic: Secondary | ICD-10-CM | POA: Insufficient documentation

## 2021-09-19 DIAGNOSIS — M25411 Effusion, right shoulder: Secondary | ICD-10-CM | POA: Diagnosis present

## 2021-09-19 DIAGNOSIS — M25511 Pain in right shoulder: Secondary | ICD-10-CM | POA: Insufficient documentation

## 2021-09-19 DIAGNOSIS — R29898 Other symptoms and signs involving the musculoskeletal system: Secondary | ICD-10-CM | POA: Insufficient documentation

## 2022-04-26 ENCOUNTER — Other Ambulatory Visit: Payer: Self-pay | Admitting: Pain Medicine

## 2022-04-26 DIAGNOSIS — G8929 Other chronic pain: Secondary | ICD-10-CM

## 2022-04-26 DIAGNOSIS — M7918 Myalgia, other site: Secondary | ICD-10-CM

## 2023-01-12 ENCOUNTER — Ambulatory Visit: Payer: Medicare Other | Admitting: Occupational Therapy

## 2023-01-13 ENCOUNTER — Ambulatory Visit: Payer: Medicare Other | Admitting: Occupational Therapy

## 2023-04-02 ENCOUNTER — Ambulatory Visit: Payer: Medicare Other | Admitting: Occupational Therapy

## 2023-05-01 ENCOUNTER — Emergency Department

## 2023-05-01 ENCOUNTER — Emergency Department
Admission: EM | Admit: 2023-05-01 | Discharge: 2023-05-01 | Disposition: A | Discharge status: Home / Self Care | Attending: Emergency Medicine | Admitting: Emergency Medicine

## 2023-05-01 ENCOUNTER — Other Ambulatory Visit: Payer: Self-pay

## 2023-05-01 DIAGNOSIS — R079 Chest pain, unspecified: Secondary | ICD-10-CM | POA: Diagnosis present

## 2023-05-01 DIAGNOSIS — E039 Hypothyroidism, unspecified: Secondary | ICD-10-CM | POA: Insufficient documentation

## 2023-05-01 DIAGNOSIS — I309 Acute pericarditis, unspecified: Secondary | ICD-10-CM | POA: Insufficient documentation

## 2023-05-01 LAB — CBC
HCT: 40.6 % (ref 36.0–46.0)
Hemoglobin: 13 g/dL (ref 12.0–15.0)
MCH: 27.8 pg (ref 26.0–34.0)
MCHC: 32 g/dL (ref 30.0–36.0)
MCV: 86.8 fL (ref 80.0–100.0)
Platelets: 359 10*3/uL (ref 150–400)
RBC: 4.68 MIL/uL (ref 3.87–5.11)
RDW: 14.6 % (ref 11.5–15.5)
WBC: 10.3 10*3/uL (ref 4.0–10.5)
nRBC: 0 % (ref 0.0–0.2)

## 2023-05-01 LAB — BASIC METABOLIC PANEL
Anion gap: 6 (ref 5–15)
BUN: 14 mg/dL (ref 8–23)
CO2: 24 mmol/L (ref 22–32)
Calcium: 9.1 mg/dL (ref 8.9–10.3)
Chloride: 107 mmol/L (ref 98–111)
Creatinine, Ser: 0.57 mg/dL (ref 0.44–1.00)
GFR, Estimated: >60 mL/min (ref >60)
Glucose, Bld: 112 mg/dL — ABNORMAL HIGH (ref 70–99)
Potassium: 3.3 mmol/L — ABNORMAL LOW (ref 3.5–5.1)
Sodium: 137 mmol/L (ref 135–145)

## 2023-05-01 LAB — TROPONIN I (HIGH SENSITIVITY)
Troponin I (High Sensitivity): 2 ng/L (ref <18)
Troponin I (High Sensitivity): <2 ng/L (ref <18)

## 2023-05-01 MED ORDER — NAPROXEN 500 MG PO TBEC: 500.0000 mg | DELAYED_RELEASE_TABLET | Freq: Two times a day (BID) | ORAL | 0 refills | Status: AC

## 2023-05-01 NOTE — ED Provider Notes (Signed)
 [facility] Provider Note    Event Date/Time   First MD Initiated Contact with Patient 05/01/23 1501     (approximate)   History   Chief Complaint Chest Pain   HPI  Cynthia Mcpherson is a 63 y.o. female with past medical history of hyperlipidemia, hypothyroidism, GERD, and chronic pain syndrome who presents to the ED complaining of chest pain.  Patient reports that she has been dealing with constant sharp pain in the center of her chest since last night that seems to be worse when she takes a deep breath and also worse when she goes to lay flat.  She denies any associated fevers or cough, has not had any pain or swelling in her legs.  She took symptoms for GERD last night without significant relief, took a dose of Tylenol this morning with partial relief.  She denies any cardiac history, has never had symptoms similar to this in the past.     Physical Exam   Triage Vital Signs: ED Triage Vitals  Encounter Vitals Group     BP 05/01/23 1422 114/85     Systolic BP Percentile --      Diastolic BP Percentile --      Pulse Rate 05/01/23 1422 95     Resp 05/01/23 1422 20     Temp 05/01/23 1422 98 F (36.7 C)     Temp Source 05/01/23 1422 Oral     SpO2 05/01/23 1422 100 %     Weight --      Height --      Head Circumference --      Peak Flow --      Pain Score 05/01/23 1224 6     Pain Loc --      Pain Education --      Exclude from Growth Chart --     Most recent vital signs: Vitals:   05/01/23 1422  BP: 114/85  Pulse: 95  Resp: 20  Temp: 98 F (36.7 C)  SpO2: 100%    Constitutional: Alert and oriented. Eyes: Conjunctivae are normal. Head: Atraumatic. Nose: No congestion/rhinnorhea. Mouth/Throat: Mucous membranes are moist.  Cardiovascular: Normal rate, regular rhythm. Grossly normal heart sounds.  2+ radial pulses bilaterally. Respiratory: Normal respiratory effort.  No retractions. Lungs CTAB.  No chest wall tenderness to  palpation. Gastrointestinal: Soft and nontender. No distention. Musculoskeletal: No lower extremity tenderness nor edema.  Neurologic:  Normal speech and language. No gross focal neurologic deficits are appreciated.    ED Results / Procedures / Treatments   Labs (all labs ordered are listed, but only abnormal results are displayed) Labs Reviewed  BASIC METABOLIC PANEL - Abnormal; Notable for the following components:      Result Value   Potassium 3.3 (*)    Glucose, Bld 112 (*)    All other components within normal limits  CBC  TROPONIN I (HIGH SENSITIVITY)  TROPONIN I (HIGH SENSITIVITY)     EKG  ED ECG REPORT I, Chesley Noon, the attending physician, personally viewed and interpreted this ECG.   Date: 05/01/2023  EKG Time: 12:25  Rate: 91  Rhythm: normal sinus rhythm  Axis: Normal  Intervals:none  ST&T Change: Diffuse ST elevation consistent with early repolarization or pericarditis  RADIOLOGY Chest x-ray reviewed and interpreted by me with no infiltrate, edema, or effusion.  PROCEDURES:  Critical Care performed: No  Procedures   MEDICATIONS ORDERED IN ED: Medications - No data to display   IMPRESSION /  MDM / ASSESSMENT AND PLAN / ED COURSE  I reviewed the triage vital signs and the nursing notes.                              63 y.o. female with past medical history of hyperlipidemia, hypothyroidism, GERD, and chronic pain syndrome who presents to the ED complaining of constant sharp pain in the center of her chest that is worse with a deep breath and lying flat since last night.  Patient's presentation is most consistent with acute presentation with potential threat to life or bodily function.  Differential diagnosis includes, but is not limited to, ACS, PE, pneumonia, pneumothorax, pericarditis, musculoskeletal pain, GERD, anxiety.  Patient nontoxic-appearing and in no acute distress, vital signs are unremarkable.  EKG shows diffuse ST elevation  consistent with benign early repolarization versus pericarditis.  Symptoms are atypical for ACS and 2 sets of troponin are within normal limits.  Remainder of labs are reassuring with no significant anemia, leukocytosis, electrolyte abnormality, or AKI.  Chest x-ray also negative for acute process.  Symptoms seem unlikely to represent PE, but patient was offered further evaluation for pulmonary embolism with CT imaging.  She declines, states that she needs to get back home to care for her husband.  We will start her on NSAIDs and provide referral to follow-up with cardiology.  She was counseled to return to the ED for new or worsening symptoms, patient agrees with plan.      FINAL CLINICAL IMPRESSION(S) / ED DIAGNOSES   Final diagnoses:  Acute pericarditis, unspecified type  Chest pain, unspecified type     Rx / DC Orders   ED Discharge Orders          Ordered    Ambulatory referral to Cardiology        05/01/23 1604    naproxen (EC NAPROSYN) 500 MG EC tablet  2 times daily with meals        05/01/23 1605             Note:  This document was prepared using Dragon voice recognition software and may include unintentional dictation errors.   Chesley Noon, MD 05/01/23 [phone]

## 2023-05-01 NOTE — ED Triage Notes (Signed)
 Pt to ED via POV from home. Pt reports last pm started having upper bilateral rib pain with radiation upwards and towards left arm. Pt also reports SOB. Pt reports pain went away and came back this morning.

## 2023-05-01 NOTE — ED Notes (Signed)
 See triage notes. Patient c/o midline chest pain that radiates under both breasts, up the neck and into the jaw. The patient also reports a headache. Symptoms began around 2000 yesterday.

## 2023-11-04 IMAGING — MR MR LUMBAR SPINE W/O CM
5 series · 33 of 48 positions shown · non-contrast
Comparison: None.

CLINICAL DATA: Osteoarthritis; lumbosacral, lumbar radiculopathy.
Low back, left buttock and thigh pain with swelling and itching in
the feet. History of surgery in 4171.

EXAM:
MRI LUMBAR SPINE WITHOUT CONTRAST
TECHNIQUE: Multiplanar, multisequence MR imaging of the lumbar spine was
performed. No intravenous contrast was administered.

[Series 5: T2 · sagittal · 4.0mm · 0.81mm/px · 7 of 17 slices shown (1 of 2)]
[im 1/17]
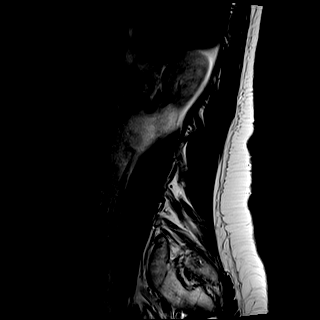
[im 3/17]
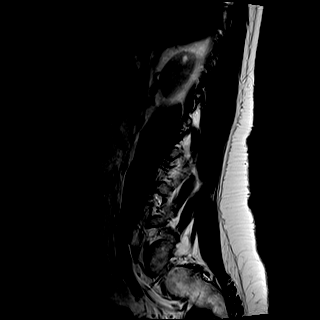
[im 6/17]
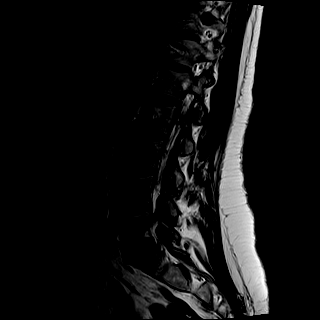
[im 9/17]
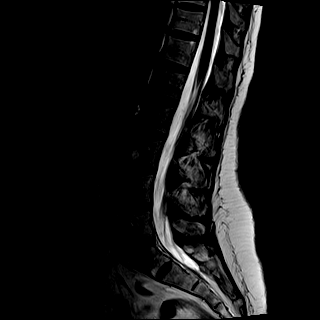
[im 11/17]
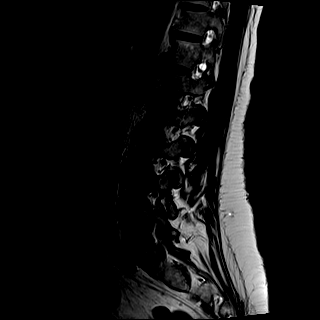
[im 14/17]
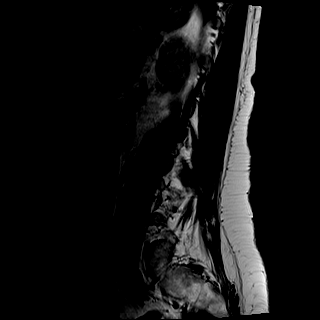
[im 17/17]
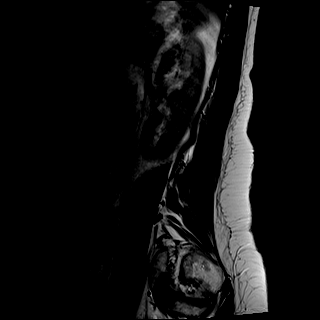

[Series 6: T1 · sagittal · 4.0mm · 0.81mm/px · 7 of 17 slices shown (1 of 2)]
[im 1/17]
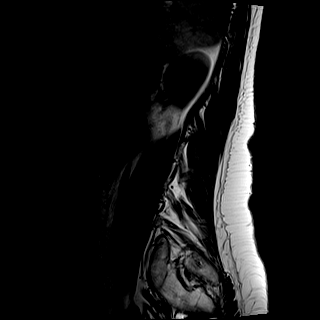
[im 3/17]
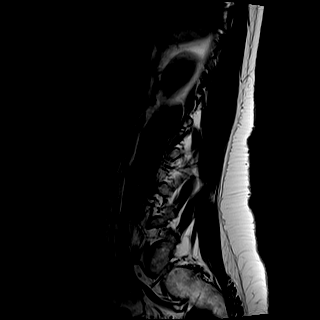
[im 6/17]
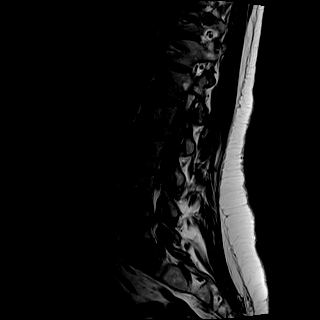
[im 9/17]
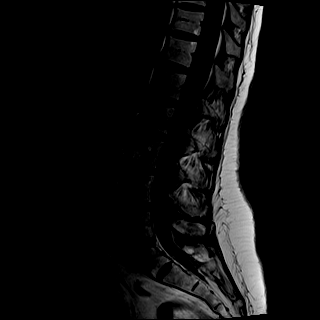
[im 11/17]
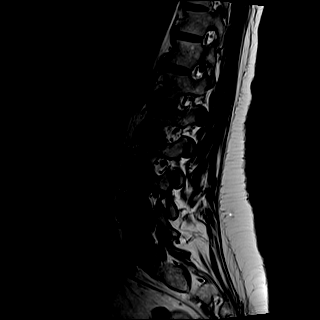
[im 14/17]
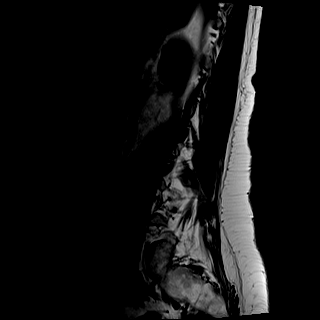
[im 17/17]
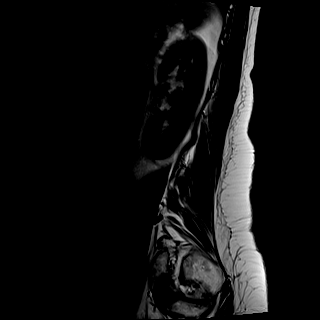

[Series 7: STIR · sagittal · 4.0mm · 0.41mm/px · 1 of 17 slices shown]
[im 1/17]
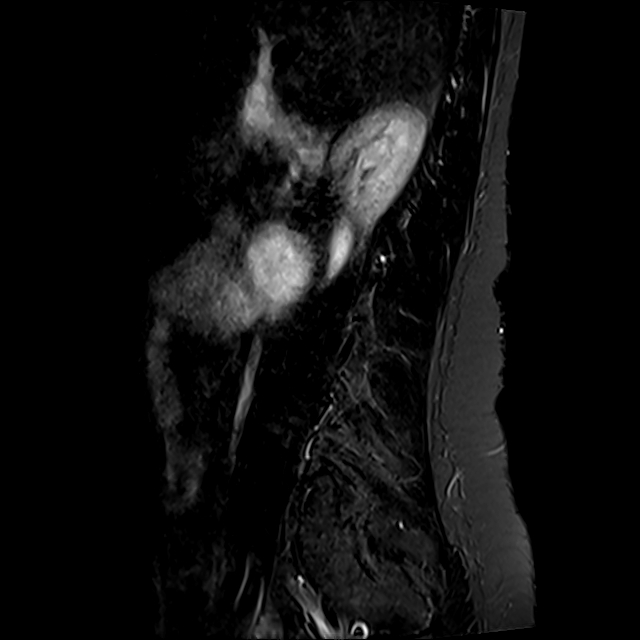

[Series 8: T2 · axial · 4.0mm · 0.78mm/px · z∈[-78,+123]mm · 9 of 28 slices shown (2 of 2)]
[im 1/28]
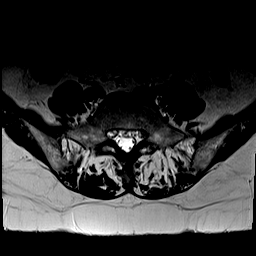
[im 5/28]
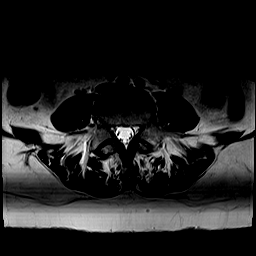
[im 10/28]
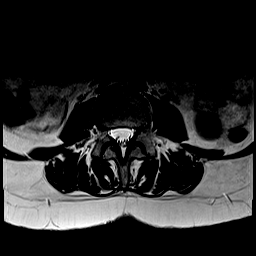
[im 12/28]
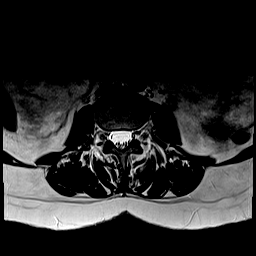
[im 14/28]
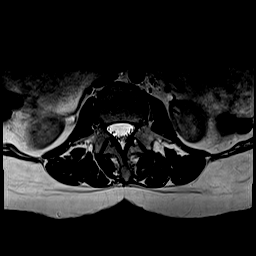
[im 16/28]
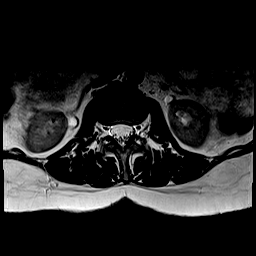
[im 19/28]
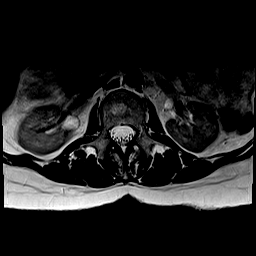
[im 23/28]
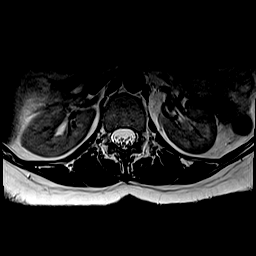
[im 28/28]
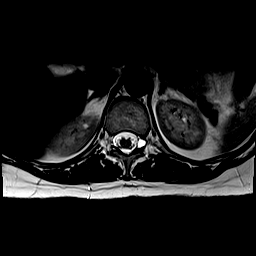

[Series 9: T1 · axial · 4.0mm · 0.39mm/px · z∈[-78,+123]mm · 9 of 28 slices shown (2 of 2)]
[im 1/28]
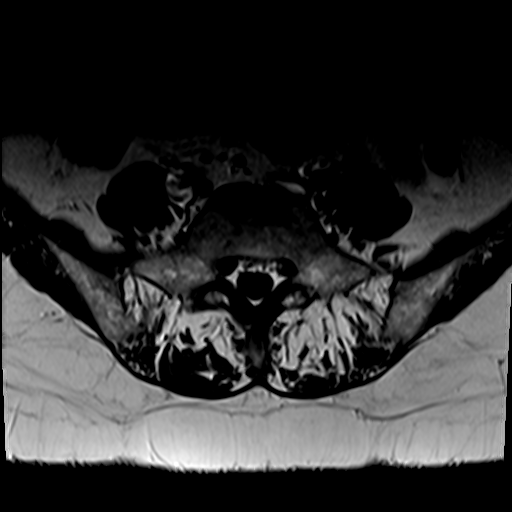
[im 5/28]
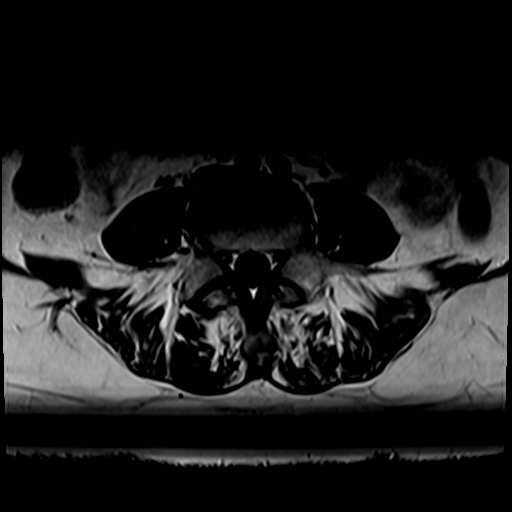
[im 10/28]
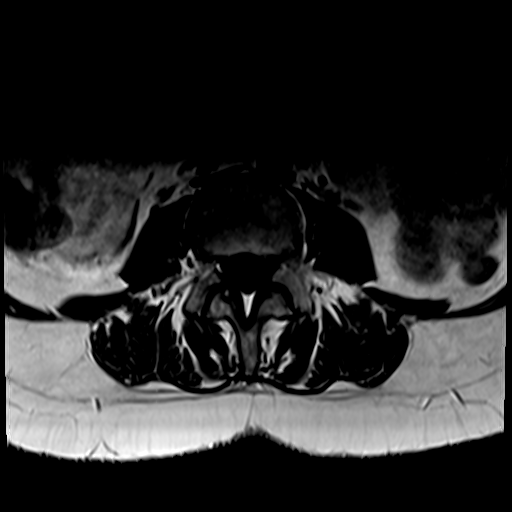
[im 12/28]
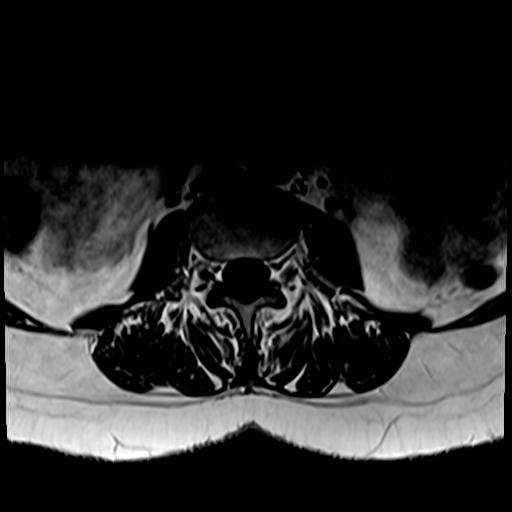
[im 14/28]
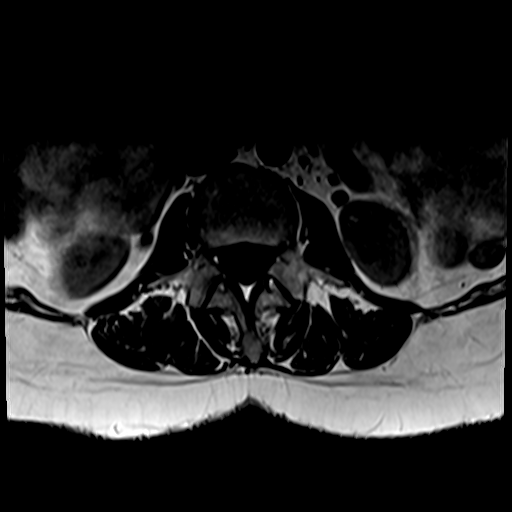
[im 16/28]
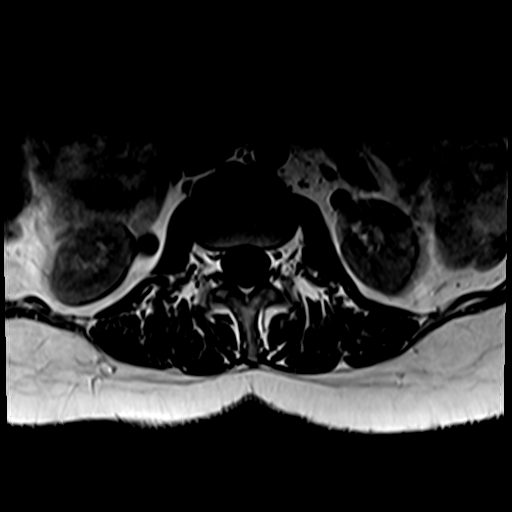
[im 19/28]
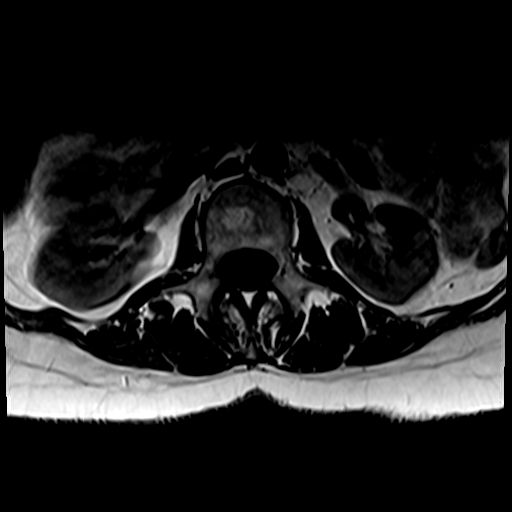
[im 23/28]
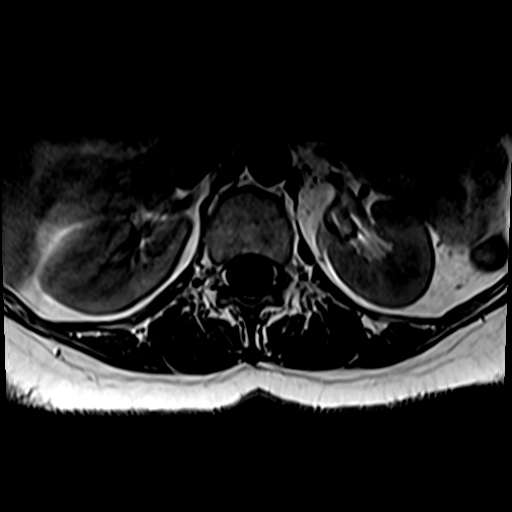
[im 28/28]
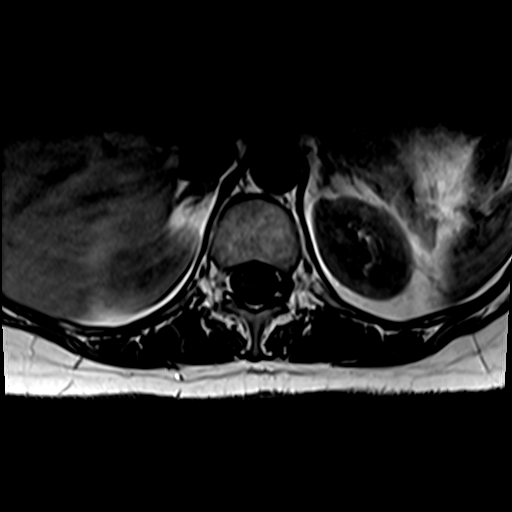

[33 of 48 positions shown; findings below may reference images not displayed]

FINDINGS: Segmentation: Five lumbar type vertebral segments. Rudimentary disc
is noted at the S1-S2 level.

Alignment:  Trace retrolisthesis of L5 on S1.

Vertebrae: No fracture, evidence of discitis, or suspicious bone
lesion.

Conus medullaris and cauda equina: Conus extends to the L1 level.
Conus and cauda equina appear normal.

Paraspinal and other soft tissues: Negative.

Disc levels:

T12-L1: Unremarkable.

L1-L2: Unremarkable.

L2-L3: Unremarkable.

L3-L4: Unremarkable.

L4-L5: Minimal annular disc bulge. Mild facet hypertrophy. No
foraminal or canal stenosis.

L5-S1: No significant disc protrusion. Mild bilateral facet
hypertrophy. No foraminal or canal stenosis.
IMPRESSION: Minimal degenerative changes at L4-L5 and L5-S1. No significant
foraminal or canal stenosis at any level.
